# Patient Record
Sex: Female | Born: 1970 | Race: White | Hispanic: No | Marital: Married | Smoking: Never smoker
Health system: Southern US, Community
[De-identification: ages and names within clinical notes are randomized; demographics above are authoritative.]

## PROBLEM LIST (undated history)

## (undated) DIAGNOSIS — Z85828 Personal history of other malignant neoplasm of skin: Secondary | ICD-10-CM

## (undated) DIAGNOSIS — R112 Nausea with vomiting, unspecified: Secondary | ICD-10-CM

## (undated) DIAGNOSIS — F329 Major depressive disorder, single episode, unspecified: Secondary | ICD-10-CM

## (undated) DIAGNOSIS — A0472 Enterocolitis due to Clostridium difficile, not specified as recurrent: Secondary | ICD-10-CM

## (undated) DIAGNOSIS — I452 Bifascicular block: Secondary | ICD-10-CM

## (undated) DIAGNOSIS — F32A Depression, unspecified: Secondary | ICD-10-CM

## (undated) DIAGNOSIS — E785 Hyperlipidemia, unspecified: Secondary | ICD-10-CM

## (undated) DIAGNOSIS — F419 Anxiety disorder, unspecified: Secondary | ICD-10-CM

## (undated) DIAGNOSIS — K219 Gastro-esophageal reflux disease without esophagitis: Secondary | ICD-10-CM

## (undated) DIAGNOSIS — Z9889 Other specified postprocedural states: Secondary | ICD-10-CM

## (undated) DIAGNOSIS — R9389 Abnormal findings on diagnostic imaging of other specified body structures: Secondary | ICD-10-CM

## (undated) DIAGNOSIS — I493 Ventricular premature depolarization: Secondary | ICD-10-CM

## (undated) DIAGNOSIS — R351 Nocturia: Secondary | ICD-10-CM

## (undated) HISTORY — DX: Gastro-esophageal reflux disease without esophagitis: K21.9

## (undated) HISTORY — DX: Major depressive disorder, single episode, unspecified: F32.9

## (undated) HISTORY — DX: Ventricular premature depolarization: I49.3

## (undated) HISTORY — DX: Depression, unspecified: F32.A

## (undated) HISTORY — PX: LUMBAR FUSION: SHX111

## (undated) HISTORY — DX: Anxiety disorder, unspecified: F41.9

## (undated) HISTORY — PX: COLONOSCOPY: SHX174

## (undated) HISTORY — PX: TONSILLECTOMY: SUR1361

## (undated) HISTORY — PX: UPPER GASTROINTESTINAL ENDOSCOPY: SHX188

## (undated) HISTORY — DX: Hyperlipidemia, unspecified: E78.5

---

## 1997-10-17 ENCOUNTER — Ambulatory Visit (HOSPITAL_COMMUNITY): Admission: RE | Admit: 1997-10-17 | Discharge: 1997-10-17 | Payer: Self-pay | Admitting: Obstetrics and Gynecology

## 1997-11-20 ENCOUNTER — Ambulatory Visit (HOSPITAL_COMMUNITY): Admission: RE | Admit: 1997-11-20 | Discharge: 1997-11-20 | Payer: Self-pay | Admitting: Obstetrics and Gynecology

## 1997-11-29 ENCOUNTER — Emergency Department (HOSPITAL_COMMUNITY): Admission: EM | Admit: 1997-11-29 | Discharge: 1997-11-29 | Payer: Self-pay | Admitting: Emergency Medicine

## 1998-02-12 ENCOUNTER — Other Ambulatory Visit: Admission: RE | Admit: 1998-02-12 | Discharge: 1998-02-12 | Payer: Self-pay | Admitting: Obstetrics and Gynecology

## 1998-03-15 ENCOUNTER — Encounter: Payer: Self-pay | Admitting: Obstetrics and Gynecology

## 1998-03-15 ENCOUNTER — Ambulatory Visit (HOSPITAL_COMMUNITY): Admission: RE | Admit: 1998-03-15 | Discharge: 1998-03-15 | Payer: Self-pay | Admitting: Obstetrics and Gynecology

## 1998-12-23 ENCOUNTER — Other Ambulatory Visit: Admission: RE | Admit: 1998-12-23 | Discharge: 1998-12-23 | Payer: Self-pay | Admitting: Obstetrics and Gynecology

## 1999-04-12 ENCOUNTER — Inpatient Hospital Stay (HOSPITAL_COMMUNITY): Admission: AD | Admit: 1999-04-12 | Discharge: 1999-04-12 | Payer: Self-pay | Admitting: Obstetrics & Gynecology

## 1999-09-11 ENCOUNTER — Inpatient Hospital Stay (HOSPITAL_COMMUNITY): Admission: AD | Admit: 1999-09-11 | Discharge: 1999-09-11 | Payer: Self-pay | Admitting: Obstetrics and Gynecology

## 1999-09-20 ENCOUNTER — Inpatient Hospital Stay: Admission: AD | Admit: 1999-09-20 | Discharge: 1999-09-20 | Payer: Self-pay | Admitting: Obstetrics and Gynecology

## 1999-09-21 ENCOUNTER — Inpatient Hospital Stay (HOSPITAL_COMMUNITY): Admission: AD | Admit: 1999-09-21 | Discharge: 1999-09-24 | Payer: Self-pay | Admitting: Obstetrics & Gynecology

## 1999-09-25 ENCOUNTER — Encounter: Admission: RE | Admit: 1999-09-25 | Discharge: 1999-10-30 | Payer: Self-pay | Admitting: Obstetrics and Gynecology

## 1999-10-23 ENCOUNTER — Other Ambulatory Visit: Admission: RE | Admit: 1999-10-23 | Discharge: 1999-10-23 | Payer: Self-pay | Admitting: Obstetrics and Gynecology

## 2000-12-02 ENCOUNTER — Other Ambulatory Visit: Admission: RE | Admit: 2000-12-02 | Discharge: 2000-12-02 | Payer: Self-pay | Admitting: Obstetrics and Gynecology

## 2001-12-26 ENCOUNTER — Other Ambulatory Visit: Admission: RE | Admit: 2001-12-26 | Discharge: 2001-12-26 | Payer: Self-pay | Admitting: Obstetrics and Gynecology

## 2002-01-28 ENCOUNTER — Inpatient Hospital Stay (HOSPITAL_COMMUNITY): Admission: AD | Admit: 2002-01-28 | Discharge: 2002-01-28 | Payer: Self-pay | Admitting: Obstetrics & Gynecology

## 2003-01-04 ENCOUNTER — Other Ambulatory Visit: Admission: RE | Admit: 2003-01-04 | Discharge: 2003-01-04 | Payer: Self-pay | Admitting: Obstetrics and Gynecology

## 2003-01-22 ENCOUNTER — Other Ambulatory Visit: Admission: RE | Admit: 2003-01-22 | Discharge: 2003-01-22 | Payer: Self-pay | Admitting: Family Medicine

## 2003-07-23 ENCOUNTER — Ambulatory Visit (HOSPITAL_BASED_OUTPATIENT_CLINIC_OR_DEPARTMENT_OTHER): Admission: RE | Admit: 2003-07-23 | Discharge: 2003-07-23 | Payer: Self-pay | Admitting: Otolaryngology

## 2004-04-08 ENCOUNTER — Emergency Department (HOSPITAL_COMMUNITY): Admission: EM | Admit: 2004-04-08 | Discharge: 2004-04-08 | Payer: Self-pay | Admitting: Family Medicine

## 2004-05-04 HISTORY — PX: AUGMENTATION MAMMAPLASTY: SUR837

## 2004-05-07 ENCOUNTER — Ambulatory Visit (HOSPITAL_COMMUNITY): Admission: RE | Admit: 2004-05-07 | Discharge: 2004-05-07 | Payer: Self-pay | Admitting: Gastroenterology

## 2004-09-22 ENCOUNTER — Encounter: Admission: RE | Admit: 2004-09-22 | Discharge: 2004-09-22 | Payer: Self-pay | Admitting: Neurological Surgery

## 2005-03-25 ENCOUNTER — Encounter: Admission: RE | Admit: 2005-03-25 | Discharge: 2005-03-25 | Payer: Self-pay | Admitting: Gastroenterology

## 2005-04-26 ENCOUNTER — Encounter: Admission: RE | Admit: 2005-04-26 | Discharge: 2005-04-26 | Payer: Self-pay | Admitting: Neurology

## 2006-02-23 ENCOUNTER — Encounter: Admission: RE | Admit: 2006-02-23 | Discharge: 2006-02-23 | Payer: Self-pay | Admitting: Gastroenterology

## 2006-05-04 ENCOUNTER — Encounter (INDEPENDENT_AMBULATORY_CARE_PROVIDER_SITE_OTHER): Payer: Self-pay | Admitting: *Deleted

## 2006-06-28 ENCOUNTER — Ambulatory Visit: Payer: Self-pay

## 2006-07-02 ENCOUNTER — Encounter (INDEPENDENT_AMBULATORY_CARE_PROVIDER_SITE_OTHER): Payer: Self-pay | Admitting: *Deleted

## 2006-07-14 ENCOUNTER — Telehealth (INDEPENDENT_AMBULATORY_CARE_PROVIDER_SITE_OTHER): Payer: Self-pay | Admitting: *Deleted

## 2006-07-19 ENCOUNTER — Telehealth: Payer: Self-pay | Admitting: *Deleted

## 2006-07-20 ENCOUNTER — Encounter (INDEPENDENT_AMBULATORY_CARE_PROVIDER_SITE_OTHER): Payer: Self-pay | Admitting: Family Medicine

## 2006-07-20 ENCOUNTER — Ambulatory Visit (HOSPITAL_COMMUNITY): Admission: RE | Admit: 2006-07-20 | Discharge: 2006-07-20 | Payer: Self-pay | Admitting: Family Medicine

## 2006-07-20 ENCOUNTER — Ambulatory Visit: Payer: Self-pay | Admitting: Family Medicine

## 2006-07-20 ENCOUNTER — Encounter: Payer: Self-pay | Admitting: Family Medicine

## 2006-07-20 DIAGNOSIS — R079 Chest pain, unspecified: Secondary | ICD-10-CM | POA: Insufficient documentation

## 2006-07-20 DIAGNOSIS — R002 Palpitations: Secondary | ICD-10-CM | POA: Insufficient documentation

## 2006-07-21 ENCOUNTER — Telehealth: Payer: Self-pay | Admitting: *Deleted

## 2006-07-26 LAB — CONVERTED CEMR LAB
Albumin: 4.7 g/dL (ref 3.5–5.2)
Alkaline Phosphatase: 44 units/L (ref 39–117)
CO2: 23 meq/L (ref 19–32)
Calcium: 9.8 mg/dL (ref 8.4–10.5)
Chloride: 104 meq/L (ref 96–112)
Glucose, Bld: 84 mg/dL (ref 70–99)
Potassium: 4.1 meq/L (ref 3.5–5.3)
Sodium: 140 meq/L (ref 135–145)
Total Protein: 7.5 g/dL (ref 6.0–8.3)

## 2006-07-31 ENCOUNTER — Encounter (INDEPENDENT_AMBULATORY_CARE_PROVIDER_SITE_OTHER): Payer: Self-pay | Admitting: Family Medicine

## 2006-12-25 ENCOUNTER — Telehealth (INDEPENDENT_AMBULATORY_CARE_PROVIDER_SITE_OTHER): Payer: Self-pay | Admitting: Family Medicine

## 2007-01-06 ENCOUNTER — Encounter: Payer: Self-pay | Admitting: *Deleted

## 2007-01-09 ENCOUNTER — Emergency Department (HOSPITAL_COMMUNITY): Admission: EM | Admit: 2007-01-09 | Discharge: 2007-01-09 | Payer: Self-pay | Admitting: Family Medicine

## 2007-02-27 ENCOUNTER — Encounter: Admission: RE | Admit: 2007-02-27 | Discharge: 2007-02-27 | Payer: Self-pay | Admitting: Neurological Surgery

## 2007-03-16 ENCOUNTER — Ambulatory Visit: Payer: Self-pay | Admitting: Family Medicine

## 2007-03-28 ENCOUNTER — Ambulatory Visit: Payer: Self-pay | Admitting: Family Medicine

## 2007-03-28 LAB — CONVERTED CEMR LAB: Rapid Strep: NEGATIVE

## 2007-05-02 ENCOUNTER — Ambulatory Visit: Payer: Self-pay | Admitting: Sports Medicine

## 2007-05-19 ENCOUNTER — Encounter: Admission: RE | Admit: 2007-05-19 | Discharge: 2007-05-19 | Payer: Self-pay | Admitting: Internal Medicine

## 2007-06-13 ENCOUNTER — Telehealth: Payer: Self-pay | Admitting: *Deleted

## 2007-06-14 ENCOUNTER — Ambulatory Visit: Payer: Self-pay | Admitting: Family Medicine

## 2007-06-14 DIAGNOSIS — H8110 Benign paroxysmal vertigo, unspecified ear: Secondary | ICD-10-CM

## 2007-06-15 ENCOUNTER — Encounter: Payer: Self-pay | Admitting: *Deleted

## 2007-06-20 ENCOUNTER — Telehealth: Payer: Self-pay | Admitting: *Deleted

## 2007-07-10 ENCOUNTER — Emergency Department (HOSPITAL_COMMUNITY): Admission: EM | Admit: 2007-07-10 | Discharge: 2007-07-10 | Payer: Self-pay | Admitting: Family Medicine

## 2007-07-28 ENCOUNTER — Encounter (INDEPENDENT_AMBULATORY_CARE_PROVIDER_SITE_OTHER): Payer: Self-pay | Admitting: *Deleted

## 2008-01-24 ENCOUNTER — Ambulatory Visit: Payer: Self-pay | Admitting: Family Medicine

## 2008-09-03 ENCOUNTER — Emergency Department (HOSPITAL_COMMUNITY): Admission: EM | Admit: 2008-09-03 | Discharge: 2008-09-03 | Payer: Self-pay | Admitting: Emergency Medicine

## 2008-09-07 ENCOUNTER — Inpatient Hospital Stay (HOSPITAL_COMMUNITY): Admission: EM | Admit: 2008-09-07 | Discharge: 2008-09-10 | Payer: Self-pay | Admitting: Emergency Medicine

## 2008-09-27 ENCOUNTER — Encounter: Admission: RE | Admit: 2008-09-27 | Discharge: 2008-09-27 | Payer: Self-pay | Admitting: Internal Medicine

## 2009-04-17 ENCOUNTER — Encounter: Admission: RE | Admit: 2009-04-17 | Discharge: 2009-04-17 | Payer: Self-pay | Admitting: Gastroenterology

## 2009-07-02 DIAGNOSIS — Z8719 Personal history of other diseases of the digestive system: Secondary | ICD-10-CM

## 2009-08-01 ENCOUNTER — Encounter: Admission: RE | Admit: 2009-08-01 | Discharge: 2009-08-01 | Payer: Self-pay | Admitting: Gastroenterology

## 2009-09-02 ENCOUNTER — Ambulatory Visit: Payer: Self-pay | Admitting: Advanced Practice Midwife

## 2009-09-02 ENCOUNTER — Inpatient Hospital Stay (HOSPITAL_COMMUNITY): Admission: AD | Admit: 2009-09-02 | Discharge: 2009-09-02 | Payer: Self-pay | Admitting: Obstetrics & Gynecology

## 2009-10-12 ENCOUNTER — Inpatient Hospital Stay (HOSPITAL_COMMUNITY): Admission: AD | Admit: 2009-10-12 | Discharge: 2009-10-12 | Payer: Self-pay | Admitting: Obstetrics

## 2009-10-12 ENCOUNTER — Ambulatory Visit: Payer: Self-pay | Admitting: Obstetrics and Gynecology

## 2009-10-19 ENCOUNTER — Inpatient Hospital Stay (HOSPITAL_COMMUNITY): Admission: AD | Admit: 2009-10-19 | Discharge: 2009-10-19 | Payer: Self-pay | Admitting: Obstetrics & Gynecology

## 2009-10-19 ENCOUNTER — Ambulatory Visit: Payer: Self-pay | Admitting: Advanced Practice Midwife

## 2009-10-23 ENCOUNTER — Emergency Department (HOSPITAL_COMMUNITY): Admission: EM | Admit: 2009-10-23 | Discharge: 2009-10-23 | Payer: Self-pay | Admitting: Emergency Medicine

## 2009-12-31 DIAGNOSIS — R197 Diarrhea, unspecified: Secondary | ICD-10-CM

## 2010-01-13 ENCOUNTER — Encounter (INDEPENDENT_AMBULATORY_CARE_PROVIDER_SITE_OTHER): Payer: Self-pay | Admitting: *Deleted

## 2010-01-13 DIAGNOSIS — K219 Gastro-esophageal reflux disease without esophagitis: Secondary | ICD-10-CM | POA: Insufficient documentation

## 2010-01-28 ENCOUNTER — Ambulatory Visit: Payer: Self-pay | Admitting: Internal Medicine

## 2010-01-28 DIAGNOSIS — F341 Dysthymic disorder: Secondary | ICD-10-CM | POA: Insufficient documentation

## 2010-02-03 ENCOUNTER — Telehealth: Payer: Self-pay

## 2010-02-04 ENCOUNTER — Encounter: Payer: Self-pay | Admitting: Internal Medicine

## 2010-02-17 ENCOUNTER — Encounter: Admission: RE | Admit: 2010-02-17 | Discharge: 2010-02-17 | Payer: Self-pay | Admitting: Obstetrics and Gynecology

## 2010-05-13 ENCOUNTER — Telehealth: Payer: Self-pay | Admitting: Internal Medicine

## 2010-05-24 ENCOUNTER — Encounter: Payer: Self-pay | Admitting: Gastroenterology

## 2010-05-25 ENCOUNTER — Encounter: Payer: Self-pay | Admitting: Gastroenterology

## 2010-06-03 NOTE — Consult Note (Signed)
Summary: [facility]:  [facility]:   Imported By: Florinda Marker 02/04/2010 09:40:20  _____________________________________________________________________  External Attachment:    Type:   Image     Comment:   External Document

## 2010-06-03 NOTE — Miscellaneous (Signed)
Summary: Problems, Medications and Alleriges updated  Clinical Lists Changes  Problems: Added new problem of ESOPHAGEAL REFLUX (ICD-530.81) Added new problem of CLOSTRIDIUM DIFFICILE COLITIS, HX OF (ICD-V12.79) Added new problem of DIARRHEA (ICD-787.91) Medications: Added new medication of CELEXA 20 MG TABS (CITALOPRAM HYDROBROMIDE) Take 1 tablet by mouth once a day per PCP Added new medication of FLORASTOR 250 MG CAPS (SACCHAROMYCES BOULARDII) Take 1 capsule by mouth once a day per PCP Removed medication of TUSSIONEX PENNKINETIC ER 8-10 MG/5ML LQCR (CHLORPHENIRAMINE-HYDROCODONE) 1 teaspoon by mouth every 12 hrs as needed  dispense 60 mL Removed medication of MECLIZINE HCL 25 MG TABS (MECLIZINE HCL) 1 to two tabs by mouth three times a day as needed vertigo Allergies: Added new allergy or adverse reaction of CODEINE

## 2010-06-03 NOTE — Progress Notes (Signed)
Summary: Concerns about C-diff  Phone Note Call from Patient Call back at Home Phone [phone]   Caller: Patient Summary of Call: Patient has concerns that she may have c-diff again. She has pressure on her rectum and loose stools. She wants Dr.Campbell to give her a call, I tried to talk to the patient but she only wanted to discuss this issue with Dr. Orvan Falconer. She will be at [phone] until 2:00pm and at home after 3:00pm at [phone].  Initial call taken by: Starleen Arms CMA,  February 03, 2010 10:46 AM  Follow-up for Phone Call        I called and left her a message to call back at my office. Follow-up by: Cliffton Asters MD,  February 08, 2010 9:00 AM  Additional Follow-up for Phone Call Additional follow up Details #1::        Returned call made to patient. She is not symptomatic she is just very worried that  it may happen again and wanted to know if this was something that comes every 6 months or every two weeks.  I spoke with patient and reassured her at this time she should not be worried. She has no symptoms that indicate c-diff has returned. She admits she has become very obsessed with this lately. pt advised if c diff was treated successfully she should be ok.  Please feel free to call is symptoms return.  Additional Follow-up by: Tomasita Morrow RN,  February 10, 2010 9:45 AM

## 2010-06-03 NOTE — Miscellaneous (Signed)
Summary: HIPAA Restrictions  HIPAA Restrictions   Imported By: Florinda Marker 01/29/2010 11:02:48  _____________________________________________________________________  External Attachment:    Type:   Image     Comment:   External Document

## 2010-06-03 NOTE — Assessment & Plan Note (Signed)
Summary: new pt diarrhea/c.diff   Primary Provider:  [facility]  CC:  Second opinion. C. Diff Diarrhea vs IBS.  History of Present Illness: [name] is a 40 year old who is referred to me by Dr. 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 She says that she was in very good health until May of 2010 when she was admitted to the hospital here in [place] with mild diarrhea.  She tells me that one C. diff toxin assay was positive but a subsequent one was negative and the doctor told her that the first one must have been a false positive.  She was however treated with Flagyl and says she improved. The colon biopsy during that hospitalization did show active colitis.  She had recurrent loose stools with some mucus in the late fall and says that the toxin assay was positive again.  She was treated with a second round of Flagyl and thinks she got better.  She was referred to a gastroenterologist at [facility] who later referred her to Dr. Ronaldo Miyamoto in the IUD division there.  She says that she did not have any further diarrhea but would have intermittent episodes of loose stools with some mucus.  Apparently a toxin assay was positive again in April.  She says that that was done by the PCR method at [facility].  She eventually had two rounds of vancomycin taper the last one concluding with a two week course of rifaximin.   I do not have complete records from [facility] but she tells me that she had a subsequent positive assay they are and completed a course of fidaxomicin a little over one month ago. She says that her last 3 colonoscopies or sigmoidoscopies have been normal.  She has not had any abnormal stools in the past month but she is extremely worried, anxious, and depressed that she will always have relapse seen Clostridium difficile diarrhea.  She is also very concerned that she might pass it to her husband and daughter.  She has been started on Celexa  recently to try to help with her depression and anxiety.  She says that she spends a great deal of time searching the Internet but mostly finds scary stories that make her worried that she might die.  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  Preventive Screening-Counseling & Management  Alcohol-Tobacco     Alcohol drinks/day: 0     Smoking Status: never  Caffeine-Diet-Exercise     Caffeine use/day: no     Does Patient Exercise: no     Type of exercise: Walking, stationary bike     Times/week: 4  Safety-Violence-Falls     Seat Belt Use: yes   Updated Prior Medication List: CELEXA 20 MG TABS (CITALOPRAM HYDROBROMIDE) Take 1 tablet by mouth once a day per PCP FLORASTOR 250 MG CAPS (SACCHAROMYCES BOULARDII) Take 1 capsule by mouth once a day per PCP IMODIUM MULTI-SYMPTOM RELIEF 2-125 MG TABS (LOPERAMIDE-SIMETHICONE) Take 1 tablet by mouth once a day  Current Allergies (reviewed today): ! VICODIN ! CODEINE ! MACROBID MORPHINE Vital Signs:  Patient profile:   40 year old female Height:      65.5 inches (166.37 cm) Weight:      114 pounds (51.82 kg) BMI:     18.75 Temp:     98.5 degrees F (36.94 degrees C) oral Pulse rate:   70 / minute BP sitting:   102 / 68  (right arm) Cuff size:  regular  Vitals Entered By: [name] RN (January 28, 2010 11:25 AM) CC: Second opinion. C. Diff Diarrhea vs IBS Is Patient Diabetic? No Pain Assessment Patient in pain? no      Nutritional Status BMI of 19 -24 = normal Nutritional Status Detail appetite "good"  Have you ever been in a relationship where you felt threatened, hurt or afraid?not asked today, husband presnt   Does patient need assistance? Functional Status Self care Ambulation Normal   Physical Exam  General:  alert and well-nourished.   Mouth:  good dentition, pharynx pink and moist, no erythema, and no exudates.   Lungs:  normal breath sounds, no crackles, and no wheezes.   Heart:  normal  rate, regular rhythm, and no murmur.   Abdomen:  soft, non-tender, normal bowel sounds, no distention, no masses, no hepatomegaly, and no splenomegaly.   Skin:  no rashes.   Axillary Nodes:  no R axillary adenopathy and no L axillary adenopathy.   Psych:  normally interactive, good eye contact, but tearful, and moderately anxious.     Impression & Recommendations:  Problem # 1:  CLOSTRIDIUM DIFFICILE COLITIS, HX OF (ICD-V12.79) She has had a very difficult time over the last year and a half it has become convinced that she will never recover from her Clostridium difficile infection.  I suspect she did have symptomatic infection in May of 2010 but I told her that I'm unable to reconcile the disparate toxin assays and endoscopies since that time.  I told her however that I would put more emphasis on the normal endoscopies than on a positive toxin assay.  She and her husband clearly admit to that her anxiety is fueling her symptoms.  Her symptoms are actually quite mild and I have suggested that she simply ride them out rather than seeking further diagnostic testing as this is only likely to increase her anxiety.  I have told her that I do not expect her to have lifelong problems and that she certainly is not at all likely to die from this condition.  I have asked her to call me if she has other questions or concerns that I can help with. Orders: Consultation Level III (72536)  Medications Added to Medication List This Visit: 1)  Imodium Multi-symptom Relief 2-125 Mg Tabs (Loperamide-simethicone) .... Take 1 tablet by mouth once a day  Patient Instructions: 1)  Please schedule a follow-up appointment as needed.

## 2010-06-03 NOTE — Consult Note (Signed)
Summary: New Pt. Referral: Dr.Salen Ganem  New Pt. Referral: Dr.Salen Evette Cristal   Imported By: Florinda Marker 01/30/2010 15:52:34  _____________________________________________________________________  External Attachment:    Type:   Image     Comment:   External Document

## 2010-06-05 NOTE — Progress Notes (Signed)
Summary: Started having mucous in stool  Phone Note Call from Patient Call back at Work Phone [phone]   Caller: Patient Call For: Dr. Cliffton Asters Reason for Call: Acute Illness, Talk to Nurse Summary of Call: Pt. reports having mucous in her stool.  Wanting to know whether she should bring in a stool specimem.   Please advise. [name] RN  May 13, 2010 10:33 AM   Follow-up for Phone Call        I do not feel she needs any diagnostic evaluations unless she is having recurrent diarrhea. I am not concerned about mucous in her stoll unless there is diarrhea.   Follow-up by: Cliffton Asters MD,  May 13, 2010 12:40 PM

## 2010-07-09 ENCOUNTER — Encounter: Payer: Self-pay | Admitting: *Deleted

## 2010-07-20 LAB — LIPASE, BLOOD: Lipase: 27 U/L (ref 11–59)

## 2010-07-20 LAB — URINALYSIS, ROUTINE W REFLEX MICROSCOPIC
Nitrite: NEGATIVE
pH: 5.5 (ref 5.0–8.0)

## 2010-07-20 LAB — POCT I-STAT, CHEM 8
Calcium, Ion: 1.06 mmol/L — ABNORMAL LOW (ref 1.12–1.32)
Glucose, Bld: 104 mg/dL — ABNORMAL HIGH (ref 70–99)
HCT: 45 % (ref 36.0–46.0)
Hemoglobin: 15.3 g/dL — ABNORMAL HIGH (ref 12.0–15.0)

## 2010-07-20 LAB — DIFFERENTIAL
Basophils Absolute: 0 10*3/uL (ref 0.0–0.1)
Eosinophils Absolute: 0.1 10*3/uL (ref 0.0–0.7)
Eosinophils Relative: 1 % (ref 0–5)

## 2010-07-20 LAB — COMPREHENSIVE METABOLIC PANEL
ALT: 29 U/L (ref 0–35)
AST: 37 U/L (ref 0–37)
Alkaline Phosphatase: 45 U/L (ref 39–117)
CO2: 23 mEq/L (ref 19–32)
Chloride: 105 mEq/L (ref 96–112)
GFR calc Af Amer: >60 mL/min (ref >60)
GFR calc non Af Amer: >60 mL/min (ref >60)
Sodium: 138 mEq/L (ref 135–145)
Total Bilirubin: 0.9 mg/dL (ref 0.3–1.2)

## 2010-07-20 LAB — CBC
Hemoglobin: 14.1 g/dL (ref 12.0–15.0)
MCH: 30.8 pg (ref 26.0–34.0)
RBC: 4.57 MIL/uL (ref 3.87–5.11)

## 2010-07-20 LAB — URINE MICROSCOPIC-ADD ON

## 2010-07-21 LAB — URINALYSIS, ROUTINE W REFLEX MICROSCOPIC
Bilirubin Urine: NEGATIVE
Bilirubin Urine: NEGATIVE
Glucose, UA: NEGATIVE mg/dL
Ketones, ur: NEGATIVE mg/dL
Nitrite: NEGATIVE
Specific Gravity, Urine: 1.02 (ref 1.005–1.030)
Urobilinogen, UA: 0.2 mg/dL (ref 0.0–1.0)
pH: 5 (ref 5.0–8.0)
pH: 5.5 (ref 5.0–8.0)

## 2010-07-21 LAB — URINE MICROSCOPIC-ADD ON

## 2010-07-21 LAB — POCT PREGNANCY, URINE
Preg Test, Ur: NEGATIVE
Preg Test, Ur: NEGATIVE

## 2010-07-21 LAB — URINE CULTURE

## 2010-07-21 LAB — WET PREP, GENITAL: Trich, Wet Prep: NONE SEEN

## 2010-07-22 LAB — URINE MICROSCOPIC-ADD ON

## 2010-07-22 LAB — URINALYSIS, ROUTINE W REFLEX MICROSCOPIC
Bilirubin Urine: NEGATIVE
Glucose, UA: NEGATIVE mg/dL
Specific Gravity, Urine: 1.025 (ref 1.005–1.030)
pH: 6 (ref 5.0–8.0)

## 2010-07-22 LAB — POCT PREGNANCY, URINE: Preg Test, Ur: NEGATIVE

## 2010-08-12 LAB — COMPREHENSIVE METABOLIC PANEL
ALT: 34 U/L (ref 0–35)
ALT: 38 U/L — ABNORMAL HIGH (ref 0–35)
AST: 32 U/L (ref 0–37)
AST: 50 U/L — ABNORMAL HIGH (ref 0–37)
Albumin: 2.7 g/dL — ABNORMAL LOW (ref 3.5–5.2)
Albumin: 2.8 g/dL — ABNORMAL LOW (ref 3.5–5.2)
Alkaline Phosphatase: 45 U/L (ref 39–117)
Alkaline Phosphatase: 50 U/L (ref 39–117)
Alkaline Phosphatase: 55 U/L (ref 39–117)
BUN: 15 mg/dL (ref 6–23)
BUN: 5 mg/dL — ABNORMAL LOW (ref 6–23)
CO2: 29 mEq/L (ref 19–32)
CO2: 31 mEq/L (ref 19–32)
Calcium: 7.8 mg/dL — ABNORMAL LOW (ref 8.4–10.5)
Chloride: 103 mEq/L (ref 96–112)
Creatinine, Ser: 0.52 mg/dL (ref 0.4–1.2)
Creatinine, Ser: 0.86 mg/dL (ref 0.4–1.2)
GFR calc Af Amer: >60 mL/min (ref >60)
GFR calc non Af Amer: >60 mL/min (ref >60)
GFR calc non Af Amer: >60 mL/min (ref >60)
Glucose, Bld: 124 mg/dL — ABNORMAL HIGH (ref 70–99)
Glucose, Bld: 79 mg/dL (ref 70–99)
Potassium: 2.8 mEq/L — ABNORMAL LOW (ref 3.5–5.1)
Potassium: 3.6 mEq/L (ref 3.5–5.1)
Potassium: 4.1 mEq/L (ref 3.5–5.1)
Sodium: 138 mEq/L (ref 135–145)
Sodium: 140 mEq/L (ref 135–145)
Total Bilirubin: 0.5 mg/dL (ref 0.3–1.2)
Total Bilirubin: 0.6 mg/dL (ref 0.3–1.2)
Total Protein: 5.4 g/dL — ABNORMAL LOW (ref 6.0–8.3)
Total Protein: 7.2 g/dL (ref 6.0–8.3)

## 2010-08-12 LAB — DRUGS OF ABUSE SCREEN W/O ALC, ROUTINE URINE
Amphetamine Screen, Ur: NEGATIVE
Barbiturate Quant, Ur: NEGATIVE
Phencyclidine (PCP): NEGATIVE

## 2010-08-12 LAB — URINE MICROSCOPIC-ADD ON

## 2010-08-12 LAB — GIARDIA/CRYPTOSPORIDIUM SCREEN(EIA)

## 2010-08-12 LAB — DIFFERENTIAL
Basophils Absolute: 0 10*3/uL (ref 0.0–0.1)
Basophils Absolute: 0 10*3/uL (ref 0.0–0.1)
Basophils Relative: 0 % (ref 0–1)
Basophils Relative: 0 % (ref 0–1)
Eosinophils Absolute: 0 10*3/uL (ref 0.0–0.7)
Eosinophils Relative: 1 % (ref 0–5)
Lymphocytes Relative: 4 % — ABNORMAL LOW (ref 12–46)
Lymphocytes Relative: 7 % — ABNORMAL LOW (ref 12–46)
Monocytes Absolute: 0.2 10*3/uL (ref 0.1–1.0)
Monocytes Relative: 4 % (ref 3–12)
Monocytes Relative: 8 % (ref 3–12)
Monocytes Relative: 8 % (ref 3–12)
Neutro Abs: 4.6 10*3/uL (ref 1.7–7.7)
Neutro Abs: 8.6 10*3/uL — ABNORMAL HIGH (ref 1.7–7.7)
Neutrophils Relative %: 82 % — ABNORMAL HIGH (ref 43–77)
Neutrophils Relative %: 86 % — ABNORMAL HIGH (ref 43–77)

## 2010-08-12 LAB — CBC
HCT: 34.5 % — ABNORMAL LOW (ref 36.0–46.0)
HCT: 45.3 % (ref 36.0–46.0)
Hemoglobin: 12.4 g/dL (ref 12.0–15.0)
Hemoglobin: 15.5 g/dL — ABNORMAL HIGH (ref 12.0–15.0)
MCHC: 34.2 g/dL (ref 30.0–36.0)
MCHC: 34.6 g/dL (ref 30.0–36.0)
MCV: 89.5 fL (ref 78.0–100.0)
MCV: 90.3 fL (ref 78.0–100.0)
MCV: 90.6 fL (ref 78.0–100.0)
Platelets: 144 10*3/uL — ABNORMAL LOW (ref 150–400)
Platelets: 167 10*3/uL (ref 150–400)
RBC: 3.95 MIL/uL (ref 3.87–5.11)
RBC: 4.34 MIL/uL (ref 3.87–5.11)
RDW: 13.4 % (ref 11.5–15.5)
RDW: 13.4 % (ref 11.5–15.5)
WBC: 8.9 10*3/uL (ref 4.0–10.5)
WBC: 9.6 10*3/uL (ref 4.0–10.5)

## 2010-08-12 LAB — CLOSTRIDIUM DIFFICILE EIA
C difficile Toxins A+B, EIA: 8
C difficile Toxins A+B, EIA: NEGATIVE

## 2010-08-12 LAB — CULTURE, BLOOD (ROUTINE X 2)
Culture: NO GROWTH
Culture: NO GROWTH

## 2010-08-12 LAB — URINALYSIS, ROUTINE W REFLEX MICROSCOPIC
Leukocytes, UA: NEGATIVE
Nitrite: NEGATIVE
Nitrite: NEGATIVE
Specific Gravity, Urine: 1.02 (ref 1.005–1.030)
Specific Gravity, Urine: 1.031 — ABNORMAL HIGH (ref 1.005–1.030)
Urobilinogen, UA: 0.2 mg/dL (ref 0.0–1.0)
Urobilinogen, UA: 0.2 mg/dL (ref 0.0–1.0)
pH: 6 (ref 5.0–8.0)

## 2010-08-12 LAB — PHOSPHORUS: Phosphorus: 1.7 mg/dL — ABNORMAL LOW (ref 2.3–4.6)

## 2010-08-12 LAB — HIV ANTIBODY (ROUTINE TESTING W REFLEX): HIV: NONREACTIVE

## 2010-08-12 LAB — URINE CULTURE

## 2010-08-12 LAB — POTASSIUM: Potassium: 3 mEq/L — ABNORMAL LOW (ref 3.5–5.1)

## 2010-08-12 LAB — STOOL CULTURE

## 2010-08-12 LAB — MAGNESIUM: Magnesium: 1.6 mg/dL (ref 1.5–2.5)

## 2010-08-12 LAB — POCT PREGNANCY, URINE: Preg Test, Ur: NEGATIVE

## 2010-08-12 LAB — CALCIUM: Calcium: 7.8 mg/dL — ABNORMAL LOW (ref 8.4–10.5)

## 2010-08-12 LAB — TSH: TSH: 1.021 u[IU]/mL (ref 0.350–4.500)

## 2010-09-16 NOTE — Consult Note (Signed)
[name], [name]              ACCOUNT NO.:  [id number]   MEDICAL RECORD NO.:  [id number]          PATIENT TYPE:  INP   LOCATION:  5532                         FACILITY:  [facility]   PHYSICIAN:  James L. Malon Kindle., M.D.DATE OF BIRTH:  02-Jun-1970   DATE OF CONSULTATION:  09/08/2008  DATE OF DISCHARGE:                                 CONSULTATION   REQUESTING PHYSICIAN:  Monte Fantasia, MD   REASON FOR CONSULTATION:  Diarrhea and dehydration.   HISTORY:  A 40 year old white female who saw my partner, Dr. Herbert Moors  this past week.  She has had 7-8 days of diarrhea.  Prior to that, she  had normal bowel movements.  He saw her in the office with progressive  bloody diarrhea and performed a colonoscopy on Wednesday 3 days ago  showing diffuse colitis.  Biopsies were obtained for evaluation for  ulcerative colitis and she was started on Lialda.  She also to obtain  stool studies through our office the results of which are not available  at the [facility] on weekends.  She became progressively dehydrated, came  to the emergency room, was weak and dizzy, felt like she was going to  pass out when standing.  She has had 7 L of IV fluid currently.  Workup  in the hospital has revealed a normal white count.  Hemoglobin dropped  from 15.5 to 11.9 probably fluid related.  Electrolytes are grossly  okay.  Her albumin has dropped from 3.8 to 2.8 with hydration.  Stool  cultures are pending.  Stool was positive for C. diff toxin.  The  patient adamantly denies any exposure to antibiotics over the previous 3  months.  I specifically asked her about acne, sore throat, urinary tract  infections, etc., and she has not had any antibiotics at all.   MEDICATIONS ON ADMISSION:  Lialda, had just gotten some samples, no  other medicines.   ALLERGIES:  She is allergic to MORPHINE.   PAST MEDICAL HISTORY:  Childbirth related otherwise negative.   FAMILY HISTORY:  Negative for inflammatory bowel  disease.   PHYSICAL EXAMINATION:  VITAL SIGNS:  Temperature 101, pulse 86, blood  pressure 97/61.  GENERAL:  A very anxious, tearful white female.  EYES:  Sclerae nonicteric.  HEART:  Regular rate and rhythm without murmurs or gallops.  LUNGS:  Clear.  ABDOMEN:  Soft with mild nonlocalizing tenderness and markedly increased  bowel sounds.   ASSESSMENT:  Acute colitis, more than likely Clostridium difficile  related given the fact that this came on suddenly.  Clostridium  difficile can be associated with ulcerative colitis and we will  definitely need to get these biopsies back.  Given the fact that her  symptoms are so acute in onset, my feeling is that this is probably not  chronic ulcerative colitis, but rather Clostridium difficile colitis.   PLAN:  We will hold steroids for now, give clear liquids, and add oral  vancomycin to her current medications.  I would continue on Cipro and  Flagyl pending the other tests.  If her biopsies appear consistent with  ulcerative colitis,  we may need to consider IV steroids.           ______________________________  Llana Aliment Malon Kindle., M.D.     Waldron Session  D:  09/08/2008  T:  09/09/2008  Job:  [id number]   cc:   Dr. Wendi Snipes, M.D.

## 2010-09-16 NOTE — Discharge Summary (Signed)
NAMEJOSALYN, [name]              ACCOUNT NO.:  [id number]   MEDICAL RECORD NO.:  [id number]          PATIENT TYPE:  INP   LOCATION:  5532                         FACILITY:  [facility]   PHYSICIAN:  Renee Ramus, MD       DATE OF BIRTH:  1971/01/29   DATE OF ADMISSION:  09/07/2008  DATE OF DISCHARGE:  09/10/2008                               DISCHARGE SUMMARY   PRIMARY DISCHARGE DIAGNOSIS:  Viral gastroenteritis.   SECONDARY DIAGNOSES:  1. Anxiety.  2. Dehydration.   HOSPITAL COURSE:  1. Viral gastroenteritis.  The patient is a 40 year old female who was      admitted secondary to recurrent diarrhea refractory to conservative      treatment.  The patient was admitted.  She was severely dehydrated.      She received aggressive IV fluids.  She was placed on broad-      spectrum antibiotics with presumptive Clostridium difficile colitis      infection.  She had 1 positive toxin assay for Clostridium      difficile colitis.  The patient has also had a colonoscopy.  The      patient's biopsy results from the colonoscopy and several other      toxin assays have proved negative for Clostridium difficile      colitis, and we believe the patient has been suffering from      gastroenteritis.  The patient did not have a white count, did not      have fevers or chills.  She has had blood cultures and urine      cultures that have been negative for infection.  The patient is now      being discharged to home with instructions to follow up with Dr.      Evette Cristal, her gastroenterologist, within 1-2 weeks.  2. Anxiety.  The patient is somewhat anxious throughout this ordeal.      She has been tearful.  She has received Ativan with good effect.      This will not be continued postdischarge.  3. Dehydration.  The patient's dehydration has been resolved with      intravenous fluid.   LABORATORY DATA OF NOTE:  1. Initial dehydration with creatinine at 0.86 and BUN at 15.  Her BUN      is now decreased  to less than 1 and creatinine at 0.55.  2. TSH of 1.021.  3. UA initially showing concentrated urine with specific gravity of      1.031 with 80 ketones and evidence of protein.  Subsequent UA      showed persistent ketones but specific gravity which has decreased      to 1.020 with no evidence of infection.      a.     C. difficile toxin assay on Sep 07, 2008, positive.      b.     Urine culture showing multiple bacterial morphologies, none       predominant.      c.     Blood cultures x2 with no growth to date.  d.     Stool culture showing no suspicious colonies.  4. C-difficile toxin assay.  Followup on Sep 09, 2008, showing no      evidence of C. difficile.  5. Multiple C. difficile toxin assays done prior to admission showing      no evidence of colitis.  6. Biopsy report from colonoscopy showing evidence of colitis, but no      specific evidence of C. difficile colitis.   STUDIES:  Chest x-ray showing no acute cardiopulmonary disease.   MEDICATIONS ON DISCHARGE:  Flora-Q p.o. b.i.d. x1 week.  The patient  will be able to buy this over the counter.   There are no labs or studies pending at time of discharge.   The patient is in stable condition and anxious for discharge.   TIME SPENT:  35 minutes       Renee Ramus, MD  Electronically Signed     JF/MEDQ  D:  09/10/2008  T:  09/11/2008  Job:  [id number]   cc:   Graylin Shiver, M.D.  [facility]

## 2010-09-16 NOTE — H&P (Signed)
[name], [name]              ACCOUNT NO.:  [id number]   MEDICAL RECORD NO.:  [id number]          PATIENT TYPE:  INP   LOCATION:  1843                         FACILITY:  [facility]   PHYSICIAN:  Monte Fantasia, MD  DATE OF BIRTH:  Dec 22, 1970   DATE OF ADMISSION:  09/07/2008  DATE OF DISCHARGE:                              HISTORY & PHYSICAL   PRIMARY CARE PHYSICIAN:  [facility].   PATIENT TIME:  30 minutes.   GI DOCTOR:  Graylin Shiver, M.D.   This is a 40 year old with no significant past medical history that  comes in for severe diarrhea.  This started at 9:00  p.m. on Saturday;  started having more than 20 bowel movements a day.  She [facility].  She got hydrated there and was discharged home.  She went home  and continued to have diarrhea on Sunday.  She saw Dr. Evette Cristal on Monday.  He did a colonoscopy, biopsy and stool cultures are pending and she was  sent home on PPI.  Her diarrhea got better but on Wednesday, she  restarted having diarrhea with more than 20 bowel movements a day with  blood in stools and fever.  She was dizzy upon standing so she decided  to come to the ED. She relates no abdominal pain, normal appetite.   ALLERGIES:  MORPHINE CAUSES NAUSEA.   PAST MEDICAL HISTORY:  No significant past medical history.   MEDICATIONS:  None.   FAMILY HISTORY:  Mother hypertension, diabetes and hyperlipidemia.  Father hypertension, diabetes and hyperlipidemia.   SOCIAL HISTORY:  Denies smoking, alcohol, drugs or tobacco.  She works  as a [employer] operational officer.   REVIEW OF SYSTEMS:  She relates no rashes but is positive for weight  loss.  She has no abdominal pain.   PHYSICAL EXAMINATION:  Her temperature today is 100.4, blood pressure  117/70, respirations 38.  Her pulse is 105.  She is saturating 96% on  room air  GENERAL APPEARANCE:  She is uncomfortable appearing and tearful.  HEENT: Anicteric.  No pallor.  NECK:  Supple.  No lymphadenopathy and no  thyromegaly.  CARDIOVASCULAR:  Tachycardic, positive S1-S2.  No murmurs, rubs or  gallops.  LUNGS:  Good air movement and clear to auscultation.  ABDOMEN:  Positive bowel sounds, nontender, nondistended and soft.  EXTREMITIES:  Positive pulses.  No edema.  NEUROLOGIC:  Nonfocal.  SKIN:  No rashes.   Labs:  Sodium 140, potassium 3.6, chloride 111, bicarb 24, BUN of 5,  creatinine 0.5, glucose 106, bilirubin 0.6, alk phos 45, AST 30, ALT 26,  total protein 5.6, albumin  2.4 and calcium 7.8.  White count 5.0, ANC  of  4.6, hemoglobin of 11.9 and an MCV of 89.5 and platelets 144.  UA  was negative.   ASSESSMENT/PLAN:  1. Severe diarrhea in a 40 year old female being worked up by Dr.      Evette Cristal with results of colonoscopy and stool cultures pending.      Differential diagnosis is concerned for ulcerative colitis.  Also      infections.  Dr. Evette Cristal  results and stool cultures are pending and      he wants to start her on antibiotics.  So we will start on IV Cipro      and Flagyl.  If she spikes a fever, will get blood cultures x2.  We      will start  hydrating her aggressively with D5 in half-normal      saline.  Will give Tylenol for fever and Protonix and SCDs for      prophylaxis.  We will also check a TSH.      Marinda Elk, M.D.  Electronically Signed      Monte Fantasia, MD  Electronically Signed    AF/MEDQ  D:  09/07/2008  T:  09/07/2008  Job:  [id number]   cc:   [facility]
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
## 2010-09-19 NOTE — Op Note (Signed)
[facility] of [facility]  Patient:    [name], [name]                       MRN: [id number] Proc. Date: 09/21/99 Adm. Date:  [id number] Attending:  Genia Del                           Operative Report  PREOPERATIVE DIAGNOSIS:       [redacted] weeks gestation, breech presentation, borderline oligohydramnios, spontaneous rupture of membranes.  POSTOPERATIVE DIAGNOSIS:      [redacted] weeks gestation, breech presentation, borderline oligohydramnios, spontaneous rupture of membranes.  OPERATION:                    Urgent primary low transverse cesarean section.  SURGEON:                      Genia Del, M.D.  ASSISTANT:                    Lenoard Aden, M.D.  ANESTHESIA:                   Ellison Hughs., M.D.  ESTIMATED BLOOD LOSS:  DESCRIPTION OF PROCEDURE:     Under spinal anesthesia, the patient is in 15 degree left decubitus position.  The patient is prepped with Betadine on the abdominal, suprapubic, and vulvar areas.  The bladder catheter is inserted and the patient is draped as usual.  A Pfannenstiel incision is made with the scalpel.  We then opened the aponeurosis transversely with the Mayo scissors.  The aponeurosis is detached from the rectus muscles in the midline.  The parietoperitoneum is opened longitudinally.  The bladder blade is inserted.  The visceroperitoneum is opened transversely over the lower uterine segment.  The bladder is retracted downward.  A hysterotomy is made with a scalpel transversely in the lower uterine segment. t is drawn on each side with fingers.  The fetus is in frank breech presentation.  Birth at 4:30 p.m. of babygirl.  The cord was clamped and cut.  The baby was given to the neonatologist.  Apgars were 9 and 9.  Blood was taken from the cord. The babys weight came back 6 pounds 7 ounces.  The placenta was evacuated spontaneously complete three vessels.  Revision of the uterus was done.   Ancef  gram IV given after the birth of the baby.  Pitocin started in the IV fluids. he uterus was well contracted.  Closure of the hysterotomy in one full plane with  running locked suture with Vicryl 0.  A second plane is done to complete hemostasis with Vicryl 0.  Hemostasis is adequate.  The uterus is normal in appearance and  volume.  The tubes are normal and mobile.  The ovaries are normal in appearance and volume.  The pelvic cavity is irrigated and suctioned.  Hemostasis is verified n the rectus muscles and the aponeurosis.  It is completed with the electrocautery. The aponeurosis is closed in two half running sutures with Vicryl 0.  The adipose tissue is verified for hemostasis.  Electrocautery is used when necessary.  The  subcutaneous tissue is infiltrated with Marcaine 0.25% and then the skin is reapproximated with staples.  A dry dressing is applied.  The estimated blood loss was 500 cc.  Sponge, needle, and instrument counts were correct x 2.  No complications occurred.  The patient was transferred to the recovery room in good condition.  Blood group is O positive.  Rubella immune. DD:  09/21/99 TD:  09/22/99 Job: [id number]

## 2010-09-19 NOTE — H&P (Signed)
[facility]  Patient:    [name], [name]                       MRN: [id number] Adm. Date:  [id number] Attending:  Genia Del                         History and Physical  DATE OF BIRTH:                09-07-1970.  HISTORY:                      This is a 40 year old woman, G2, P0, A1, last menstrual period on December 30, 1998, for an expected date of delivery October 05, 1999, currently at [redacted] weeks gestation.  REASON FOR ADMISSION:         Spontaneous rupture of membranes at 1 p.m. on Sep 21, 1999, breech presentation.  HISTORY OF PRESENT ILLNESS:  Fetal movements positive, vaginal bleeding, irregular uterine contractions, no PIH symptoms.  Patient felt gush of fluids at 1 p.m. today, then clear fluids was coming out continuously.  PAST MEDICAL HISTORY:         Negative.  PAST SURGICAL HISTORY:        1. Back surgeries in 1995 and 1997.                               2. Had laparoscopy for tubal obstruction in                                  1998.  GYNECOLOGICAL HISTORY:        History of infertility and endometriosis x 5 years.  PAST OBSTETRICAL HISTORY:     In 1999, 8 weeks, therapeutic abortion, no complication.  MEDICATIONS:                  Prenatal vitamins.  ALLERGIES:                    MORPHINE and VICODIN.  SOCIAL HISTORY:               Married, nonsmoker.  HISTORY PRESENT PREGNANCY:    First trimester normal.  Labs in first trimester showed hemoglobin 12.5, platelets 247, blood group O positive, antibodies negative.  RPR nonreactive.  Rubella immune.  HBSAG negative. HIV nonreactive.   In the second trimester she had vaginal bleeding, for which an ultrasound was done showing low lying placenta with normal amniotic fluid. Fetal heart rate 155.  Then, she had triple test which was within normal limits.  Her review of anatomy on ultrasound was within normal limits at 20+ weeks.  Placenta was normal at that point.  Fluid was  normal.  One-hour GTT was increased at 28+ weeks.  Three-hour GTT was normal.  At 30+ weeks, an ultrasound showed oligohydramnios with appropriate growth and normal Dopplers. Follow-up ultrasound for amnio thick fluid showed inferior limits of normal fluid at the 10th percentile, breech presentation at 35+ weeks gestation.  The amniotic fluid index was at the 37th. percentile and Dopplers were normal. Persistent breech presentation was present.  The decision was made to schedule for a C-section given the borderline amniotic fluid index in the third trimester.  REVIEW OF SYSTEMS:  Constitutional negative.  HEENT:  Negative. Respiratory negative.  Cardiovascular negative.  GI negative.  Urological negative.  Dermatologic negative.  Neurologic negative.  PHYSICAL EXAMINATION  GENERAL:                      In no apparent distress.  VITAL SIGNS:                  Blood pressure 121/61, respiratory rate 20, pulse 72, temperature 98.7.  LUNGS:                        Normal at the office.  HEART:                        Normal at the office.  ABDOMEN:                      Uterus gravid.  Uterine height corresponds. Ultrasound at the bed showed persistent breech.  nitrazine and fern were positive.  Clear fluid was present.  Uterine contractions were slightly increased, irregular, and weak.  Fetal heart tone baseline 140/min.  Good accelerations.  No decelerations.  Good variability.  EXTREMITIES:                  Lower limbs normal.  IMPRESSION:                   Thirty-eight weeks gestation with persistent breech with spontaneous rupture of membranes, ____________ labor.  Had a meal at 11:30 this a.m. Fetal well-being reassuring.  Group beta Streptococcus was negative.  PLAN:                         Admit for urgent C-section.  Surgery plus risks discussed with the patient.  Will give antibiotic after the birth of the baby. Dr. Billy Coast will assist. DD:  09/21/99 TD:   09/21/99 Job: [id number]

## 2010-09-19 NOTE — Discharge Summary (Signed)
[facility] of [facility]  Patient:    [name], [name]                       MRN: [id number] Adm. Date:  [id number] Disc. Date: [id number] Attending:  Genia Del                           Discharge Summary  DATE OF BIRTH:                [birth year]  ADMISSION DIAGNOSIS:          A [redacted] week gestation, breech presentation, with spontaneous rupture of membranes in early labor.  PREOPERATIVE DIAGNOSIS:       A [redacted] week gestation, breech presentation, with spontaneous rupture of membranes in early labor.  SURGERY:                      Urgent primary low transverse C section done on Sep 21, 1999.  POSTOPERATIVE DIAGNOSES:      1. A [redacted] week gestation, breech presentation,                                  with spontaneous rupture of membranes in                                  early labor.                               2. Birth of a baby girl at 4:30 p.m. on Sep 21, 1999.  Apgars were 9 and 9.  DISCHARGE DIAGNOSES:          1. A [redacted] week gestation, breech presentation,                                  with spontaneous rupture of membranes in                                  early labor.                               2. Birth of a baby girl at 4:30 p.m. on Sep 21, 1999.  Apgars were 9 and 9.                               3. No postoperative complications.  HOSPITAL COURSE:              Uneventful.  Postoperative hemoglobin was 11.6. Hematocrit was 32.6.  Patient was discharged postoperative day #3. DD:  10/31/99 TD:  11/01/99 Job: [id number]

## 2010-09-19 NOTE — Op Note (Signed)
NAME:  [name], [name]              ACCOUNT NO.:  [id number]   MEDICAL RECORD NO.:  [id number]          PATIENT TYPE:  AMB   LOCATION:  ENDO                         FACILITY:  [facility]   PHYSICIAN:  Graylin Shiver, M.D.   DATE OF BIRTH:  July 22, 1970   DATE OF PROCEDURE:  05/07/2004  DATE OF DISCHARGE:                                 OPERATIVE REPORT   PROCEDURE:  Esophagogastroduodenoscopy with biopsy for CLOtest.   INDICATIONS:  History of gastroesophageal reflux.  Patient is now on Nexium  40 mg b.i.d. with noticeable improvement of her symptoms over the once-a-day  dosage.  Endoscopy is being done to look for any evidence of mucosal damage.   Informed consent was obtained after explanation of the risks of bleeding,  infection, and perforation.   PREMEDICATION:  Fentanyl 75 mcg IV, Versed 7.5 mg IV.   PROCEDURE:  With the patient in the left lateral decubitus position, the  Olympus gastroscope was inserted into the oropharynx and passed into the  esophagus.  It was advanced down the esophagus, then into the stomach and  into the duodenum.  The second portion and bulb of the duodenum looked  normal.  The stomach showed a mild diffuse erythematous appearance to the  mucosa compatible with gastritis.  No ulcers or erosions were seen.  Biopsy  for CLOtest was obtained.  The fundus and cardia looked normal on  retroflexion.  The esophagus revealed the esophagogastric junction to be  normal at 40 cm.  The esophageal mucosa looked normal.  She tolerated the  procedure well without complications.   IMPRESSION:  Mild gastritis.  No evidence of esophageal mucosal damage.   PLAN:  Continue Nexium 40 mg b.i.d.       SFG/MEDQ  D:  05/07/2004  T:  05/07/2004  Job:  [id number]   cc:   Bryan Lemma. Manus Gunning, M.D.  [address] [place]  [place] [address]  Fax: [phone]

## 2011-01-13 ENCOUNTER — Other Ambulatory Visit: Payer: Self-pay | Admitting: Obstetrics and Gynecology

## 2011-01-13 DIAGNOSIS — Z1231 Encounter for screening mammogram for malignant neoplasm of breast: Secondary | ICD-10-CM

## 2011-01-26 LAB — POCT URINALYSIS DIP (DEVICE)
Glucose, UA: NEGATIVE
Ketones, ur: NEGATIVE
Operator id: 235561
Protein, ur: NEGATIVE
Specific Gravity, Urine: 1.01

## 2011-01-26 LAB — URINE CULTURE: Culture: NO GROWTH

## 2011-01-26 LAB — WET PREP, GENITAL: Trich, Wet Prep: NONE SEEN

## 2011-01-26 LAB — POCT PREGNANCY, URINE: Operator id: 235561

## 2011-02-13 LAB — POCT URINALYSIS DIP (DEVICE)
Glucose, UA: NEGATIVE
Ketones, ur: NEGATIVE
Operator id: 247071
Protein, ur: NEGATIVE
Specific Gravity, Urine: 1.02
Urobilinogen, UA: 0.2

## 2011-02-20 ENCOUNTER — Ambulatory Visit: Payer: Self-pay

## 2011-03-25 ENCOUNTER — Ambulatory Visit: Payer: Self-pay

## 2011-04-01 ENCOUNTER — Ambulatory Visit
Admission: RE | Admit: 2011-04-01 | Discharge: 2011-04-01 | Disposition: A | Discharge status: Home / Self Care | Payer: BC Managed Care – PPO | Source: Ambulatory Visit | Attending: Obstetrics and Gynecology | Admitting: Obstetrics and Gynecology

## 2011-04-01 DIAGNOSIS — Z1231 Encounter for screening mammogram for malignant neoplasm of breast: Secondary | ICD-10-CM

## 2011-10-13 ENCOUNTER — Telehealth: Payer: Self-pay | Admitting: *Deleted

## 2011-10-13 NOTE — Telephone Encounter (Signed)
Patient called stating she is a previous patient of Dr. Blair Dolphin.  She was seen for CDiff,  01/28/10.  She was recently dx with a UTI and prescribed Keflex.  She has not taken it yet as she is afraid of another recurrence of CDiff.  Would like Dr. Blair Dolphin opinion. Wendall Mola CMA

## 2011-10-13 NOTE — Telephone Encounter (Signed)
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 She woke this morning with urinary urgency and saw her primary care physician, Dr. Tanya Nones, and was told that she had a mild bladder infection. She does not believe a urine culture was obtained. She was told her that she do one of 2 things. She could push fluids, cranberry juice and wait it out or try a narrow spectrum antibiotic like cephalexin. She states that she has taken some over-the-counter Azo and her urinary urgency is much better already. She states that she is deathly afraid of taking any antibiotics so I suggested that she try symptomatic therapy without antibiotics and she has she does over the next few days. I suggested that she call me back if she is not improving by end of the week.

## 2012-02-22 ENCOUNTER — Other Ambulatory Visit: Payer: Self-pay | Admitting: Obstetrics and Gynecology

## 2012-02-22 DIAGNOSIS — Z1231 Encounter for screening mammogram for malignant neoplasm of breast: Secondary | ICD-10-CM

## 2012-03-18 ENCOUNTER — Encounter (HOSPITAL_COMMUNITY): Payer: Self-pay | Admitting: Emergency Medicine

## 2012-03-18 ENCOUNTER — Telehealth: Payer: Self-pay

## 2012-03-18 ENCOUNTER — Emergency Department (HOSPITAL_COMMUNITY)
Admission: EM | Admit: 2012-03-18 | Discharge: 2012-03-18 | Disposition: A | Discharge status: Home / Self Care | Payer: BC Managed Care – PPO | Attending: Emergency Medicine | Admitting: Emergency Medicine

## 2012-03-18 DIAGNOSIS — A0472 Enterocolitis due to Clostridium difficile, not specified as recurrent: Secondary | ICD-10-CM | POA: Insufficient documentation

## 2012-03-18 HISTORY — DX: Enterocolitis due to Clostridium difficile, not specified as recurrent: A04.72

## 2012-03-18 LAB — CBC WITH DIFFERENTIAL/PLATELET
Basophils Absolute: 0 10*3/uL (ref 0.0–0.1)
Lymphocytes Relative: 14 % (ref 12–46)
Lymphs Abs: 0.9 10*3/uL (ref 0.7–4.0)
MCV: 89.6 fL (ref 78.0–100.0)
Neutro Abs: 5.7 10*3/uL (ref 1.7–7.7)
Platelets: 202 10*3/uL (ref 150–400)
RBC: 4.5 MIL/uL (ref 3.87–5.11)
RDW: 12.7 % (ref 11.5–15.5)
WBC: 6.9 10*3/uL (ref 4.0–10.5)

## 2012-03-18 LAB — CLOSTRIDIUM DIFFICILE BY PCR: Toxigenic C. Difficile by PCR: POSITIVE — AB

## 2012-03-18 LAB — COMPREHENSIVE METABOLIC PANEL
AST: 21 U/L (ref 0–37)
Albumin: 4.1 g/dL (ref 3.5–5.2)
BUN: 14 mg/dL (ref 6–23)
Calcium: 9.7 mg/dL (ref 8.4–10.5)
Creatinine, Ser: 0.7 mg/dL (ref 0.50–1.10)
Total Bilirubin: 0.5 mg/dL (ref 0.3–1.2)
Total Protein: 7.2 g/dL (ref 6.0–8.3)

## 2012-03-18 MED ORDER — METRONIDAZOLE 500 MG PO TABS: 500.0000 mg | ORAL_TABLET | Freq: Three times a day (TID) | ORAL | Status: DC

## 2012-03-18 NOTE — ED Notes (Signed)
Pts stool is well formed, brown stool with small amount of mucous.

## 2012-03-18 NOTE — ED Notes (Signed)
Pt states she has had mucous stool since last night. Pt has Hx of C.dif.

## 2012-03-18 NOTE — Telephone Encounter (Signed)
Pt called c/o she was given Flagyl which has never worked for c-diff with her.  She is requesting Dificid which she states was given by [facility]  I spoke with Dr Drue Second who stated she would like patient to take the flagyl for at least 3 days and follow up with Dr Orvan Falconer.  She does not feel  Dificid is needed at this point based on symptoms.   Appointment given with Dr Orvan Falconer for Thursday next week.  Pt has accepted the appointment and stated she will try the Flagyl.   Laurell Josephs, RN

## 2012-03-18 NOTE — ED Provider Notes (Signed)
History     CSN: [id number]  Arrival date & time 03/18/12  2440   First MD Initiated Contact with Patient 03/18/12 [phone]      Chief Complaint  Patient presents with  . Diarrhea    (Consider location/radiation/quality/duration/timing/severity/associated sxs/prior treatment) HPI Comments: 41 year old female with a history of C. difficile presents emergency department complaining of mucus stools as of last night. She was diagnosed with C. difficile 4 years ago, and was on antibiotics for one year. The C. difficile went away for 2 years and she believes his back as of last night. States she has normal formed stool with mucus along with just mucus when she did not half to move her bowels. Denies any other symptoms at this time. No abdominal pain, diarrhea, constipation, fever, chills, nausea or vomiting. No recent antibiotic usage. She takes Land on a daily basis. She was followed by [facility] along with Dr. Orvan Falconer with infectious disease.  Patient is a 41 y.o. female presenting with diarrhea. The history is provided by the patient.  Diarrhea Primary symptoms do not include abdominal pain, nausea or diarrhea.  The illness does not include constipation.    No past medical history on file.  No past surgical history on file.  No family history on file.  History  Substance Use Topics  . Smoking status: Not on file  . Smokeless tobacco: Not on file  . Alcohol Use: Not on file    OB History    No data available      Review of Systems  Gastrointestinal: Negative for nausea, abdominal pain, diarrhea, constipation and blood in stool.       Positive for mucus stools  All other systems reviewed and are negative.    Allergies  Codeine; Hydrocodone-acetaminophen; Morphine; and Nitrofurantoin  Home Medications   Current Outpatient Rx  Name  Route  Sig  Dispense  Refill  . CITALOPRAM HYDROBROMIDE 20 MG PO TABS   Oral   Take 20 mg by mouth daily. Per PCP          .  LOPERAMIDE-SIMETHICONE 2-125 MG PO TABS   Oral   Take 1 tablet by mouth daily.           Marland Kitchen SACCHAROMYCES BOULARDII 250 MG PO CAPS   Oral   Take 250 mg by mouth daily. Per PCP            BP 115/57  Pulse 84  Temp 98.2 F (36.8 C)  Resp 16  SpO2 99%  Physical Exam  Constitutional: She is oriented to person, place, and time. She appears well-developed and well-nourished. No distress.       Appears anxious and upset  HENT:  Head: Normocephalic and atraumatic.  Mouth/Throat: Oropharynx is clear and moist.  Eyes: Conjunctivae normal are normal.  Neck: Normal range of motion. Neck supple.  Cardiovascular: Normal rate, regular rhythm and normal heart sounds.   Pulmonary/Chest: Effort normal and breath sounds normal.  Abdominal: Soft. Bowel sounds are normal. She exhibits no distension and no mass. There is no tenderness.  Musculoskeletal: Normal range of motion.  Neurological: She is alert and oriented to person, place, and time.  Skin: Skin is warm and dry.  Psychiatric: Her speech is normal and behavior is normal. Her mood appears anxious.       Tearful    ED Course  Procedures (including critical care time)  Labs Reviewed  CBC WITH DIFFERENTIAL - Abnormal; Notable for the following:    Neutrophils  Relative 82 (*)     All other components within normal limits  CLOSTRIDIUM DIFFICILE BY PCR - Abnormal; Notable for the following:    C difficile by pcr POSITIVE (*)     All other components within normal limits  COMPREHENSIVE METABOLIC PANEL  STOOL CULTURE   No results found.   1. C. difficile colitis       MDM  41 y/o female with positive C. Diff. Spoke with ID Dr. Ilsa Iha who suggested patient begin Flagyl 500 mg TID if she has 3 more mucous stools. A member of her practice will call patient to set up an appt. Patient aware of this conversation with Dr. Ilsa Iha and understands plan.         Trevor Mace, PA-C 03/18/12 1109

## 2012-03-21 LAB — STOOL CULTURE

## 2012-03-22 ENCOUNTER — Telehealth: Payer: Self-pay | Admitting: *Deleted

## 2012-03-22 ENCOUNTER — Telehealth: Payer: Self-pay | Admitting: Licensed Clinical Social Worker

## 2012-03-22 NOTE — Telephone Encounter (Signed)
0

## 2012-03-22 NOTE — ED Provider Notes (Signed)
Medical screening examination/treatment/procedure(s) were performed by non-physician practitioner and as supervising physician I was immediately available for consultation/collaboration.   Dione Booze, MD 03/22/12 [phone]

## 2012-03-22 NOTE — Telephone Encounter (Signed)
Patient called stating that the flagyl is not helping for her diarrhea.  Has appt with Dr. Orvan Falconer this Thursday. Wendall Mola

## 2012-03-24 ENCOUNTER — Ambulatory Visit (INDEPENDENT_AMBULATORY_CARE_PROVIDER_SITE_OTHER): Payer: BC Managed Care – PPO | Admitting: Internal Medicine

## 2012-03-24 ENCOUNTER — Encounter: Payer: Self-pay | Admitting: Internal Medicine

## 2012-03-24 VITALS — BP 130/84 | HR 101 | Temp 98.3°F | Wt 119.0 lb

## 2012-03-24 DIAGNOSIS — Z8719 Personal history of other diseases of the digestive system: Secondary | ICD-10-CM

## 2012-03-24 MED ORDER — VANCOMYCIN HCL 125 MG PO CAPS: 125.0000 mg | ORAL_CAPSULE | Freq: Four times a day (QID) | ORAL | Status: DC

## 2012-03-24 MED ORDER — FIDAXOMICIN 200 MG PO TABS: 200.0000 mg | ORAL_TABLET | Freq: Two times a day (BID) | ORAL | Status: DC

## 2012-03-24 NOTE — Addendum Note (Signed)
Addended by: Cliffton Asters on: 03/24/2012 05:09 PM   Modules accepted: Orders

## 2012-03-24 NOTE — Progress Notes (Addendum)
Patient ID: [name], female   DOB: [birth year], 41 y.o.   MRN: [id number]    [facility] for Infectious Disease  Patient Active Problem List  Diagnosis  . ANXIETY DEPRESSION  . POSITIONAL VERTIGO  . ESOPHAGEAL REFLUX  . PALPITATIONS  . CHEST PAIN  . DIARRHEA  . CLOSTRIDIUM DIFFICILE COLITIS, HX OF    Patient's Medications  New Prescriptions   FIDAXOMICIN (DIFICID) 200 MG TABS TABLET    Take 1 tablet (200 mg total) by mouth 2 (two) times daily.  Previous Medications   CITALOPRAM (CELEXA) 10 MG TABLET    Take 10 mg by mouth daily.   SACCHAROMYCES BOULARDII (FLORASTOR) 250 MG CAPSULE    Take 250 mg by mouth daily. Per PCP   Modified Medications   No medications on file  Discontinued Medications   LOPERAMIDE HCL (IMODIUM PO)    Take 1 tablet by mouth every 4 (four) hours as needed. For diarrhea   METRONIDAZOLE (FLAGYL) 500 MG TABLET    Take 1 tablet (500 mg total) by mouth 3 (three) times daily.    Subjective: I last saw [name] in September of 2011. 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 She had a confusing history of conflicting C. Difficile assays and endoscopies. She had some positive assays interspersed with negative assays and normal endoscopies. She underwent treatments with metronidazole, multiple pulsed taper courses of vancomycin,rifaximin and finally fidaxomicin shortly before she saw me in 2011. After receiving fidaxomicin she finally improved and had been free of diarrhea for 2 years and 3 months before developing watery stools with mucus one week ago. She had been taking probiotics consistently along with 2 daily Imodium but stop the Imodium when she gets sick again. The only other antibiotic she has received in the last 2 years or 2 doses of trimethoprim sulfamethoxazole last July when she had cystitis. She stopped taking the trimethoprim sulfamethoxazole early because of concerns that it could provoke a relapse.  She  was seen in the emergency department on November 15 where her C. Difficile PCR was positive. She was started on oral metronidazole. She feels like her diarrhea has gotten more severe and frequent. She is also noted a metallic taste in her mouth and a white coating on her tongue. She has had some nausea since starting the metronidazole but no vomiting. She has not had any fever, chills or abdominal pain.  She admits that she has been obsessed with the possibility of relapse and it has been making her extremely anxious and depressed. She is also been dealing with recent onset of nighttime seizures and her 23 year old daughter. She is very interested in whether or not she would be a candidate for fecal transplant.   Objective: Temp: 98.3 F (36.8 C) (11/21 1505) Temp src: Oral (11/21 1505) BP: 130/84 mmHg (11/21 1505) Pulse Rate: 101  (11/21 1505)  General: she is tearful during the exam but alert and appropriate Skin: her face is flushed but she has no rash Lungs: clear Cor: regular S1-S2 no murmurs Abdomen: soft and nontender with hyperactive bowel sounds     Assessment: She developed another relapse of C. Difficile colitis, possibly triggered by the trimethoprim sulfamethoxazole last July. I will change her from metronidazole to fidaxomicin and have her follow up next week. If she has persistent diarrhea or relapses again she may be a good candidate to consider fecal transplant using our new protocol.  Plan: 1. Change metronidazole to  fidaxomicin 2. Followup in one week   Cliffton Asters, MD [facility] for Infectious Disease [facility] [phone] pager   [phone] cell 03/24/2012, 3:41 PM   Addendum: The [employer] [facility] preapproval form for fidaxomicin indicates that he will probably not be approved since she has not been on vancomycin within the past 30 days. I did submit the preapproval form with my note today requesting approval but will go ahead  and put her on vancomycin now as an intermediate step.  Cliffton Asters, MD [facility] for Infectious Disease [facility] [phone] pager   [phone] cell 03/24/2012, 5:09 PM

## 2012-03-28 ENCOUNTER — Other Ambulatory Visit: Payer: Self-pay | Admitting: Licensed Clinical Social Worker

## 2012-03-30 ENCOUNTER — Encounter: Payer: Self-pay | Admitting: Internal Medicine

## 2012-03-30 ENCOUNTER — Ambulatory Visit (INDEPENDENT_AMBULATORY_CARE_PROVIDER_SITE_OTHER): Payer: BC Managed Care – PPO | Admitting: Internal Medicine

## 2012-03-30 VITALS — BP 115/76 | HR 64 | Temp 97.6°F | Ht 66.0 in | Wt 118.5 lb

## 2012-03-30 DIAGNOSIS — Z8719 Personal history of other diseases of the digestive system: Secondary | ICD-10-CM

## 2012-03-30 DIAGNOSIS — R197 Diarrhea, unspecified: Secondary | ICD-10-CM

## 2012-03-30 NOTE — Progress Notes (Signed)
Patient ID: [name], female   DOB: 06-21-1970, 41 y.o.   MRN: [id number]    [facility] for Infectious Disease  Patient Active Problem List  Diagnosis  . ANXIETY DEPRESSION  . POSITIONAL VERTIGO  . ESOPHAGEAL REFLUX  . PALPITATIONS  . CHEST PAIN  . DIARRHEA  . CLOSTRIDIUM DIFFICILE COLITIS, HX OF    Patient's Medications  New Prescriptions   No medications on file  Previous Medications   CITALOPRAM (CELEXA) 10 MG TABLET    Take 10 mg by mouth daily.   SACCHAROMYCES BOULARDII (FLORASTOR) 250 MG CAPSULE    Take 250 mg by mouth daily. Per PCP    VANCOMYCIN (VANCOCIN) 125 MG CAPSULE    Take 1 capsule (125 mg total) by mouth 4 (four) times daily.  Modified Medications   No medications on file  Discontinued Medications   No medications on file    Subjective: [name] is in for her followup visit. She has now been on antibiotic therapy for C. Difficile colitis for 12 days including vancomycin orally for the past 7. She is feeling some better. She has not had any further nausea or vomiting. Her stool is not quite as watery but is not formed yet either. She is still having frequent bowel movements 4-5 times per day. She has not had any abdominal cramping and she states that her appetite and oral intake is good. She has not been having any fever.  Objective: Temp: 97.6 F (36.4 C) (11/27 1437) Temp src: Oral (11/27 1437) BP: 115/76 mmHg (11/27 1437) Pulse Rate: 64  (11/27 1437)  General: she is in much better spirits Lungs: clear Cor: regular S1 and S2 with no murmurs Abdomen: soft and nontender with active bowel sounds   Assessment: She is improving on vancomycin therapy for C. Difficile colitis. If her diarrhea persists or recurs I will try to get her access to a fidaxomicin. If she remains symptomatic beyond that trial then she would be a candidate for fecal transplantation.  Plan: 1. Complete one more week of oral vancomycin 2. Return to clinic in 2  weeks   Cliffton Asters, MD [facility] for Infectious Disease [facility] [phone] pager   [phone] cell 03/30/2012, 2:48 PM

## 2012-04-04 ENCOUNTER — Other Ambulatory Visit: Payer: Self-pay | Admitting: Licensed Clinical Social Worker

## 2012-04-04 ENCOUNTER — Telehealth: Payer: Self-pay | Admitting: *Deleted

## 2012-04-04 DIAGNOSIS — Z8719 Personal history of other diseases of the digestive system: Secondary | ICD-10-CM

## 2012-04-04 MED ORDER — VANCOMYCIN HCL 125 MG PO CAPS: 125.0000 mg | ORAL_CAPSULE | Freq: Four times a day (QID) | ORAL | Status: DC

## 2012-04-04 NOTE — Telephone Encounter (Signed)
Patient called and advised she is still having loose stools and it still smells bad. She advised Dr Orvan Falconer told her to call the clinic if this was still going on. She still reports no cramps or nausea. Advised her will send Dr Orvan Falconer a message and see what he wants her to do at this point or if he just wants to give it a few more days as she will take her last dose of antibiotic today.

## 2012-04-04 NOTE — Telephone Encounter (Signed)
Please ask Ms. Stockwell to call me by Friday of this week. If she continues to have diarrhea after completing oral vancomycin I will reapply to [facility] to see if she can be treated with Dificid.

## 2012-04-04 NOTE — Telephone Encounter (Signed)
Sophronia Simas Called the patient and advised her of Dr Blair Dolphin instructions.

## 2012-04-11 ENCOUNTER — Telehealth: Payer: Self-pay | Admitting: Licensed Clinical Social Worker

## 2012-04-11 NOTE — Telephone Encounter (Signed)
Patient left a message this morning stating that she  is still having frequent stools that are slightly formed. She wants the other medication Dificid called in for her and a prior approval done so she can get the medication. I will send this to Dr. Orvan Falconer per patient request. I attempted to call her back but she did not answer.

## 2012-04-12 ENCOUNTER — Other Ambulatory Visit: Payer: Self-pay | Admitting: Internal Medicine

## 2012-04-12 DIAGNOSIS — Z8719 Personal history of other diseases of the digestive system: Secondary | ICD-10-CM

## 2012-04-12 MED ORDER — FIDAXOMICIN 200 MG PO TABS: 200.0000 mg | ORAL_TABLET | Freq: Two times a day (BID) | ORAL | Status: DC

## 2012-04-12 NOTE — Telephone Encounter (Signed)
She is still symptomatic despite a recent course of vancomycin for recurrent C. difficile colitis. I will see if her insurance company will approve use of fidaxomicin.

## 2012-04-12 NOTE — Telephone Encounter (Signed)
I called the insurance to request another form to sign and fax back.

## 2012-04-14 ENCOUNTER — Ambulatory Visit (INDEPENDENT_AMBULATORY_CARE_PROVIDER_SITE_OTHER): Payer: BC Managed Care – PPO | Admitting: Internal Medicine

## 2012-04-14 ENCOUNTER — Encounter: Payer: Self-pay | Admitting: Internal Medicine

## 2012-04-14 VITALS — BP 108/75 | HR 64 | Temp 98.1°F | Ht 66.0 in | Wt 119.5 lb

## 2012-04-14 DIAGNOSIS — Z8719 Personal history of other diseases of the digestive system: Secondary | ICD-10-CM

## 2012-04-14 NOTE — Progress Notes (Signed)
Patient ID: [name], female   DOB: Aug 09, 1970, 41 y.o.   MRN: [id number]    [facility]  Patient Active Problem List  Diagnosis  . ANXIETY DEPRESSION  . POSITIONAL VERTIGO  . ESOPHAGEAL REFLUX  . PALPITATIONS  . CHEST PAIN  . DIARRHEA  . CLOSTRIDIUM DIFFICILE COLITIS, HX OF    Patient's Medications  New Prescriptions   No medications on file  Previous Medications   CITALOPRAM (CELEXA) 10 MG TABLET    Take 10 mg by mouth daily.   FIDAXOMICIN (DIFICID) 200 MG TABS TABLET    Take 1 tablet (200 mg total) by mouth 2 (two) times daily.   SACCHAROMYCES BOULARDII (FLORASTOR) 250 MG CAPSULE    Take 250 mg by mouth daily. Per PCP    VANCOMYCIN (VANCOCIN) 250 MG CAPSULE    Take 250 mg by mouth 4 (four) times daily.  Modified Medications   No medications on file  Discontinued Medications   No medications on file    Subjective: She has now been on oral vancomycin for 2-1/2 weeks and is still having 5-6 loose stools daily. She has intermittent mucus in her stools but no blood. She does not have any nausea, vomiting or abdominal cramping.  Objective: Temp: 98.1 F (36.7 C) (12/12 0856) Temp src: Oral (12/12 0856) BP: 108/75 mmHg (12/12 0856) Pulse Rate: 64  (12/12 0856)  General: She is thin her weight is stable at 119 pounds. She is tearful. Abdomen: Soft and nontender with quiet bowel sounds   Assessment: She has persistent symptoms of C. difficile colitis which has been recurrent over the last 3 years. This is despite another round of oral vancomycin. I will try to obtain fidaxomicin for her. I will also set her up to see my partner, Dr. Jerolyn Center, for consideration of fecal transplantation.  Plan: 1. Attempt to get fidaxomicin 2. Consider fecal transplantation if she is not getting better   Cliffton Asters, MD [facility] [phone] pager   [phone] cell 04/14/2012, 9:34 AM

## 2012-04-19 ENCOUNTER — Encounter: Payer: Self-pay | Admitting: Internal Medicine

## 2012-04-19 ENCOUNTER — Ambulatory Visit (INDEPENDENT_AMBULATORY_CARE_PROVIDER_SITE_OTHER): Payer: BC Managed Care – PPO | Admitting: Internal Medicine

## 2012-04-19 ENCOUNTER — Ambulatory Visit: Payer: BC Managed Care – PPO | Admitting: Internal Medicine

## 2012-04-19 VITALS — BP 120/75 | HR 70 | Temp 98.1°F | Wt 120.0 lb

## 2012-04-19 DIAGNOSIS — A0471 Enterocolitis due to Clostridium difficile, recurrent: Secondary | ICD-10-CM

## 2012-04-19 DIAGNOSIS — A0472 Enterocolitis due to Clostridium difficile, not specified as recurrent: Secondary | ICD-10-CM

## 2012-04-19 NOTE — Progress Notes (Signed)
[facility] CLINIC NOTE  RFV: evaluation for FMT Subjective:    Patient ID: [name], female    DOB: 12-23-1970, 41 y.o.   MRN: [id number]  HPI [name] is a pleasant 41yo F with history of Cdifficile colitis in 2009-2010, had been disease free until this year. On 11/15 recurrence went to ED, (+ Cdiff pcr), she was first given flagyl which did not improve her symptoms, then subsequently transitioned to oral vanco. She had been seen in ID clinic on12/12  by Dr. Orvan Falconer, where still having 5-6 loose stools, no significant improvement and now switched to fidoxamicin.  Originally had cdifficile in 2009- 2010. Initially thought had viral gastroenteritis, was eventually hospitalized since due to dehydration, and weight loss #10. Interestingly had no history of having recent antibiotics, this was roughly in MAy of 2010. She relapsed in oct 2010, had significant work up ( 3 addn colonoscopies). Treated by ID at [facility], Dr. Kerby Nora, vancomycin taper. Xifaxin, as well as fidoxamicin as a new drug at the time, where it finally relieved her symptoms. She was fine for 68yr and 3 months.This past June had uti, had 2 doses of bactrim and stopped early due to concern for recurrence.  Mid November went to ED due to acute mucousy, nonbloody loose and watery stool where she was placed on flagyl sick to her stomach, but transitioned to vancomycin but still had 3-4 BM. Decision to transition to fidoxamicin( now of day  #4 of treatment). Still having 3-4 loose stools per day, but "mushy", no longer having mucous in her stool.Doesn't feel that vanco working like it previously did in 2010 Current Outpatient Prescriptions on File Prior to Visit  Medication Sig Dispense Refill  . citalopram (CELEXA) 10 MG tablet Take 10 mg by mouth daily.      . fidaxomicin (DIFICID) 200 MG TABS tablet Take 1 tablet (200 mg total) by mouth 2 (two) times daily.  20 tablet  0  . saccharomyces boulardii (FLORASTOR) 250 MG capsule Take 250  mg by mouth daily. Per PCP        Active Ambulatory Problems    Diagnosis Date Noted  . ANXIETY DEPRESSION 01/28/2010  . POSITIONAL VERTIGO 06/14/2007  . ESOPHAGEAL REFLUX 01/13/2010  . PALPITATIONS 07/20/2006  . CHEST PAIN 07/20/2006  . DIARRHEA 12/31/2009  . CLOSTRIDIUM DIFFICILE COLITIS, HX OF 07/02/2009   Resolved Ambulatory Problems    Diagnosis Date Noted  . No Resolved Ambulatory Problems   Past Medical History  Diagnosis Date  . C. difficile diarrhea   . Anxiety and depression    History   Social History  . Marital Status: Married    Spouse Name: N/A    Number of Children: 1  . 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 Smoking status: Never Smoker   . Smokeless tobacco: Never Used  . Alcohol Use: No  . Drug Use: No  . Sexually Active: Not on file   Other Topics Concern  . Not on file   Social History Narrative  . No narrative on file   family history includes Diabetes in her father and mother and Lymphoma in her paternal grandmother.  Review of Systems 10 point ros reviewed negative except what is mentioned in HPI    Objective:   Physical Exam BP 120/75  Pulse 70  Temp 98.1 F (36.7 C) (Oral)  Wt 120 lb (54.432 kg) Physical Exam  Constitutional:  oriented to person, place, and time.  well-developed and well-nourished. tearful HENT: [place]/AT, perrla, eomi, no scleral icterus Mouth/Throat: Oropharynx is clear and moist. No oropharyngeal exudate.  Cardiovascular: Normal rate, regular rhythm and normal heart sounds. Exam reveals no gallop and no friction rub. No murmur heard.  Pulmonary/Chest: Effort normal and breath sounds normal. No respiratory distress. He has no wheezes.  Abdominal: Soft. Bowel sounds are normal. He exhibits no distension. There is no tenderness.  Lymphadenopathy:  no cervical adenopathy.   Skin: Skin is warm and dry. No rash noted. No erythema.  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 Interestingly she had been disease free for 2 yrs and now has recurrence. Did have some antibiotic exposure several months ago but unusual for c.difficile not to occur shortly thereafter if this is antibiotic-associated.  plan = continue on fidoxamicin. Will see if can do patient assistance for 30 days of coverage; refer to Dr. Leone Payor to see if we can evaluate for FMT.   - continue with florastor BID  - will arrange for her brother to be screened: [name] 11/06/60  Greater than 45 min spent discuss process of FMT, IC and cost associated with testing donor and counseling for c.difficile treatment  ([email or web address])

## 2012-04-20 ENCOUNTER — Other Ambulatory Visit: Payer: Self-pay

## 2012-04-20 ENCOUNTER — Encounter: Payer: Self-pay | Admitting: *Deleted

## 2012-04-20 ENCOUNTER — Other Ambulatory Visit: Payer: BC Managed Care – PPO

## 2012-04-20 ENCOUNTER — Ambulatory Visit (INDEPENDENT_AMBULATORY_CARE_PROVIDER_SITE_OTHER): Payer: BC Managed Care – PPO | Admitting: Internal Medicine

## 2012-04-20 ENCOUNTER — Encounter: Payer: Self-pay | Admitting: Internal Medicine

## 2012-04-20 VITALS — BP 100/62 | HR 96 | Ht 65.25 in | Wt 118.4 lb

## 2012-04-20 DIAGNOSIS — A0472 Enterocolitis due to Clostridium difficile, not specified as recurrent: Secondary | ICD-10-CM

## 2012-04-20 MED ORDER — POLYETHYLENE GLYCOL 3350 17 GM/SCOOP PO POWD: ORAL | Status: DC

## 2012-04-20 MED ORDER — METOCLOPRAMIDE HCL 10 MG PO TABS: ORAL_TABLET | ORAL | Status: DC

## 2012-04-20 MED ORDER — BISACODYL 5 MG PO TBEC: DELAYED_RELEASE_TABLET | ORAL | Status: DC

## 2012-04-20 NOTE — Progress Notes (Signed)
Subjective:  Referred by Cynthia Snider, MD   Patient ID: [name], female    DOB: 12/16/1970, 41 y.o.   MRN: [id number]  HPI This is a very nice 41yo white woman with a history of Cdifficile colitis in 2009-2010 with resolution finally after numerous treatments. On 03/18/2012 she  went to ED because of recurrent diarrhea that reminded her of C diff., (+ Cdiff pcr). Treatment  was first metronidazole which did not improve her symptoms, then subsequently changed to oral vancomycin. She had been seen in ID clinic on12/12/13 by Dr. Campbell, and was still having 5-6 loose stools, no significant improvement and changed to fidoxamicin. She is improving with less diarrhea but not yet resolved. The fidoxamicin was $900, she is getting help from Dr. Snider to hopefully get reimbursed due to metronidazole and vancomycin failure.  Originally had cdifficile in 2009- 2010. Initially thought had viral gastroenteritis, was eventually hospitalized since due to dehydration, and weight loss #10. Interestingly had no history of having recent antibiotics, this was roughly in May of 2010. She relapsed in oct 2010, had significant work up ( 3 addn colonoscopies). Treated by ID at [facility], Dr. Peacock, vancomycin taper. Xifaxin, as well as fidoxamicin as a new drug at the time, where it finally relieved her symptoms. She was fine for 2yr and 3 months.This past June had uti, had 2 doses of Bactrim and stopped early due to concern for recurrence.  Mid November went to ED due to acute mucousy, nonbloody loose and watery stool where she was placed on flagyl sick to her stomach, but transitioned to vancomycin but still had 3-4 BM. Decision to transition to fidoxamicin. Still having 3-4 loose stools per day, but "mushy", no longer having mucous in her stool.Doesn't feel that vancomycin as effective as it was in 2010.  She has been on Florastor chronically since 2010.  Allergies  Allergen Reactions  . Codeine   .  Hydrocodone-Acetaminophen     REACTION: Palpitations  . Morphine     REACTION: Nausea  . Nitrofurantoin     REACTION: flu-like symtoms   Outpatient Prescriptions Prior to Visit  Medication Sig Dispense Refill  . citalopram (CELEXA) 10 MG tablet Take 10 mg by mouth daily.      . fidaxomicin (DIFICID) 200 MG TABS tablet Take 1 tablet (200 mg total) by mouth 2 (two) times daily.  20 tablet  0  . saccharomyces boulardii (FLORASTOR) 250 MG capsule Take 250 mg by mouth daily. Per PCP       . [DISCONTINUED] vancomycin (VANCOCIN) 250 MG capsule Take 250 mg by mouth 4 (four) times daily.       Last reviewed on 04/20/2012  9:15 AM by Sophia A Davis, CMA Past Medical History  Diagnosis Date  . C. difficile diarrhea   . Esophageal reflux   . Anxiety and depression    Past Surgical History  Procedure Date  . Lumbar fusion 1994, 1997    x 2, T11-L3  . Cesarean section   . Upper gastrointestinal endoscopy   . Colonoscopy    History   Social History  . Marital Status: Married    Spouse Name: N/A    Number of Children: 1  . Years of Education: N/A   Occupational History  . manager [employer]   Social History Main Topics  . Smoking status: Never Smoker   . Smokeless tobacco: Never Used  . Alcohol Use: No  . Drug Use: No  . Sexually Active:   None   Other Topics Concern  . None   Social History Narrative  . None   Family History  Problem Relation Age of Onset  . Diabetes Mother   . Diabetes Father   . Lymphoma Paternal Grandmother    Review of Systems + anxiety, night sweats, insomnia and is menopausal. All other ROS negative except as per HPI    Objective:   Physical Exam General:  Well-developed, well-nourished and in no acute distress Eyes:  anicteric. ENT:   Mouth and posterior pharynx free of lesions.  Neck:   supple w/o thyromegaly or mass.  Lungs: Clear to auscultation bilaterally. Heart:  S1S2, no rubs, murmurs, gallops. Abdomen:  soft,  non-tender, no hepatosplenomegaly, hernia, or mass and BS+.  Lymph:  no cervical or supraclavicular adenopathy. Extremities:   no edema Skin   no rash. Neuro:  A&O x 3.  Psych:  appropriate mood and  Affect.   Data Reviewed: ID notes Previous colonoscopy and GI consult notes 2010    Assessment & Plan:   1. C. difficile colitis - recurrent    1. I agree that she is an appropriate candidate for fecal microbiotica transplant 2. Risks and benefits of colonoscopy-administered FMT explained to patient. She was given a copy of the IRB consent. 3. Anticipate FMT off fidoxamicin once brother screened. 4. She has MiraLax prep for colonoscopy instructions - will call her and fine tune those once we have date/time 5. She may try DanActive yogurt drink   Cc:PICKARD,WARREN TOM, MD and CYNTHIA SNIDER, MD  

## 2012-04-20 NOTE — Telephone Encounter (Signed)
Reglan, Miralax and Dulcolax sent to [facility] by accident , re-sent to [facility] as patient requested.  Called [facility] and LM on there VM cancelling rx's.

## 2012-04-20 NOTE — Patient Instructions (Addendum)
DanActive is the yogurt drink we discussed.  My office will contact you about the timing of the colonoscopy and fecal microbiotica transplant.  Today we are giving you Miralax instructions for prepping for your colonoscopy.  Thank you for choosing me and [facility].  Iva Boop, M.D., [facility]

## 2012-04-20 NOTE — Progress Notes (Signed)
Assisted the patient with the filling out of her Dificid patient assistance. Filled out the forms and faxed the information along with documentation to the company. Was called within 30 mins to advised that the patient was approved and will be reimbursed for the out of pocket form 04/2012 $917.12 but that per her insurance plan she has maxed out her Rx limit and until it resets 05/04/2012 she will have to pay out of pocket for Any Rx she may need filled. They have been in contact with the patient and she understands but once she resets in January they will help her with this medication.

## 2012-04-22 ENCOUNTER — Telehealth: Payer: Self-pay | Admitting: *Deleted

## 2012-04-22 NOTE — Telephone Encounter (Signed)
Patient called stating she will be out of her antibiotic on Monday and is requesting a refill.  She also emailed Dr. Duayne Cal CMA

## 2012-04-24 NOTE — Telephone Encounter (Signed)
We will need to check with [name] if the patient has been approved for patient assistance from the pharmaceutical company for dificid. If not approve, will give her rx for vancomycin 125mg  QID x 14 d

## 2012-04-25 ENCOUNTER — Ambulatory Visit (INDEPENDENT_AMBULATORY_CARE_PROVIDER_SITE_OTHER): Payer: BC Managed Care – PPO | Admitting: Internal Medicine

## 2012-04-25 ENCOUNTER — Other Ambulatory Visit: Payer: Self-pay | Admitting: *Deleted

## 2012-04-25 DIAGNOSIS — Z8719 Personal history of other diseases of the digestive system: Secondary | ICD-10-CM

## 2012-04-25 DIAGNOSIS — A0472 Enterocolitis due to Clostridium difficile, not specified as recurrent: Secondary | ICD-10-CM

## 2012-04-25 MED ORDER — VANCOMYCIN 50 MG/ML ORAL SOLUTION: 125.0000 mg | Freq: Four times a day (QID) | ORAL | Status: DC

## 2012-04-25 NOTE — Progress Notes (Signed)
Patient ID: [name], female   DOB: July 23, 1970, 41 y.o.   MRN: [id number]  Patient will do a 10 day course of oral vancomycin since she is still having loose stools. dificid course has finished. Unable to get patient assist to cover until January 1st. Patient has capped her outpatient pharmacy coverage.  Will send in rx to [facility] vanco liquid 125mg  QID x 10day= $112 per course

## 2012-04-25 NOTE — Telephone Encounter (Signed)
Per Dr Drue Second after conversation with the patient.

## 2012-05-03 ENCOUNTER — Ambulatory Visit
Admission: RE | Admit: 2012-05-03 | Discharge: 2012-05-03 | Disposition: A | Discharge status: Home / Self Care | Payer: BC Managed Care – PPO | Source: Ambulatory Visit | Attending: Obstetrics and Gynecology | Admitting: Obstetrics and Gynecology

## 2012-05-03 DIAGNOSIS — Z1231 Encounter for screening mammogram for malignant neoplasm of breast: Secondary | ICD-10-CM

## 2012-05-04 DIAGNOSIS — Z8619 Personal history of other infectious and parasitic diseases: Secondary | ICD-10-CM

## 2012-05-04 HISTORY — DX: Personal history of other infectious and parasitic diseases: Z86.19

## 2012-05-05 ENCOUNTER — Other Ambulatory Visit: Payer: Self-pay | Admitting: Internal Medicine

## 2012-05-09 ENCOUNTER — Encounter: Payer: Self-pay | Admitting: Internal Medicine

## 2012-05-09 ENCOUNTER — Other Ambulatory Visit: Payer: Self-pay

## 2012-05-09 ENCOUNTER — Telehealth: Payer: Self-pay

## 2012-05-09 DIAGNOSIS — A0472 Enterocolitis due to Clostridium difficile, not specified as recurrent: Secondary | ICD-10-CM

## 2012-05-09 NOTE — Telephone Encounter (Signed)
Patient is scheduled for 05/16/12 12:30 , she will come for a pre-visit on 05/12/12 0830 at [facility]..  She has two days left of vancomycin.

## 2012-05-09 NOTE — Telephone Encounter (Signed)
Message copied by Annett Fabian on Mon May 09, 2012  9:52 AM ------      Message from: Iva Boop      Created: Fri May 06, 2012  2:33 PM      Regarding: RE: FMT -  scheduling       Midday 1/13 or 1/14 at [place]            If not then week of 1/20 (hospital week)      ----- Message -----         From: Rossie Muskrat, RN,CGRN         Sent: 05/06/2012   1:51 PM           To: Iva Boop, MD      Subject: RE: FMT -  scheduling                                    [facility] could do it at 1:00 on Monday, but the patient absolutely can't do Monday.  Any other options?      ----- Message -----         From: Iva Boop, MD         Sent: 05/06/2012   1:26 PM           To: Rossie Muskrat, RN,CGRN      Subject: RE: FMT -  scheduling                                    Any chance they would do her at [facility]?      ----- Message -----         From: Rossie Muskrat, RN,CGRN         Sent: 05/06/2012   9:23 AM           To: Iva Boop, MD      Subject: RE: FMT -  scheduling                                    There is no availability either day, any other options?      ----- Message -----         From: Iva Boop, MD         Sent: 05/02/2012   5:44 PM           To: Rossie Muskrat, RN,CGRN      Subject: FMT -  scheduling                                        Please see if I can do an FMT in this lady 1/6 midday?            Or 1/10 midday

## 2012-05-10 ENCOUNTER — Other Ambulatory Visit: Payer: Self-pay | Admitting: Internal Medicine

## 2012-05-10 DIAGNOSIS — A0472 Enterocolitis due to Clostridium difficile, not specified as recurrent: Secondary | ICD-10-CM

## 2012-05-10 MED ORDER — VANCOMYCIN HCL 125 MG PO CAPS: 125.0000 mg | ORAL_CAPSULE | Freq: Four times a day (QID) | ORAL | Status: DC

## 2012-05-12 ENCOUNTER — Ambulatory Visit (AMBULATORY_SURGERY_CENTER): Payer: BC Managed Care – PPO | Admitting: *Deleted

## 2012-05-12 VITALS — Ht 65.75 in | Wt 122.8 lb

## 2012-05-12 DIAGNOSIS — A0472 Enterocolitis due to Clostridium difficile, not specified as recurrent: Secondary | ICD-10-CM

## 2012-05-13 SURGERY — Surgical Case
Anesthesia: *Unknown

## 2012-05-16 ENCOUNTER — Encounter (HOSPITAL_COMMUNITY): Payer: Self-pay | Admitting: *Deleted

## 2012-05-16 ENCOUNTER — Encounter (HOSPITAL_COMMUNITY)
Admission: RE | Disposition: A | Discharge status: Home / Self Care | Payer: Self-pay | Source: Ambulatory Visit | Attending: Internal Medicine

## 2012-05-16 ENCOUNTER — Ambulatory Visit (HOSPITAL_COMMUNITY)
Admission: RE | Admit: 2012-05-16 | Discharge: 2012-05-16 | Disposition: A | Discharge status: Home / Self Care | Payer: BC Managed Care – PPO | Source: Ambulatory Visit | Attending: Internal Medicine | Admitting: Internal Medicine

## 2012-05-16 DIAGNOSIS — R197 Diarrhea, unspecified: Secondary | ICD-10-CM | POA: Insufficient documentation

## 2012-05-16 DIAGNOSIS — F341 Dysthymic disorder: Secondary | ICD-10-CM | POA: Insufficient documentation

## 2012-05-16 DIAGNOSIS — A0472 Enterocolitis due to Clostridium difficile, not specified as recurrent: Secondary | ICD-10-CM

## 2012-05-16 DIAGNOSIS — K219 Gastro-esophageal reflux disease without esophagitis: Secondary | ICD-10-CM | POA: Insufficient documentation

## 2012-05-16 HISTORY — DX: Depression, unspecified: F32.A

## 2012-05-16 HISTORY — DX: Major depressive disorder, single episode, unspecified: F32.9

## 2012-05-16 HISTORY — DX: Other specified postprocedural states: R11.2

## 2012-05-16 HISTORY — DX: Other specified postprocedural states: Z98.890

## 2012-05-16 HISTORY — PX: COLONOSCOPY: SHX5424

## 2012-05-16 HISTORY — DX: Anxiety disorder, unspecified: F41.9

## 2012-05-16 SURGERY — COLONOSCOPY
Anesthesia: Moderate Sedation

## 2012-05-16 MED ORDER — DIPHENHYDRAMINE HCL 50 MG/ML IJ SOLN
INTRAMUSCULAR | Status: DC | PRN
  Administered 2012-05-16: 25 mg via INTRAVENOUS

## 2012-05-16 MED ORDER — FENTANYL CITRATE 0.05 MG/ML IJ SOLN
INTRAMUSCULAR | Status: AC
  Filled 2012-05-16: qty 2

## 2012-05-16 MED ORDER — FENTANYL CITRATE 0.05 MG/ML IJ SOLN
INTRAMUSCULAR | Status: DC | PRN
  Administered 2012-05-16 (×4): 25 ug via INTRAVENOUS

## 2012-05-16 MED ORDER — DIPHENHYDRAMINE HCL 50 MG/ML IJ SOLN
INTRAMUSCULAR | Status: AC
  Filled 2012-05-16: qty 1

## 2012-05-16 MED ORDER — LOPERAMIDE HCL 2 MG PO CAPS
4.0000 mg | ORAL_CAPSULE | Freq: Once | ORAL | Status: AC
  Administered 2012-05-16: 4 mg via ORAL
  Filled 2012-05-16: qty 2

## 2012-05-16 MED ORDER — SODIUM CHLORIDE 0.9 % IV SOLN
INTRAVENOUS | Status: DC
  Administered 2012-05-16: 500 mL via INTRAVENOUS

## 2012-05-16 MED ORDER — MIDAZOLAM HCL 5 MG/5ML IJ SOLN
INTRAMUSCULAR | Status: DC | PRN
  Administered 2012-05-16 (×5): 2 mg via INTRAVENOUS

## 2012-05-16 MED ORDER — MIDAZOLAM HCL 5 MG/ML IJ SOLN
INTRAMUSCULAR | Status: AC
  Filled 2012-05-16: qty 2

## 2012-05-16 NOTE — Op Note (Addendum)
[facility] [address] [place], [address]   COLONOSCOPY PROCEDURE REPORT  PATIENT: [name], [name]  MR#: [id number] BIRTHDATE: August 27, 1970 , 41  yrs. old GENDER: Female ENDOSCOPIST: Iva Boop, MD, [facility] REFERRED [id number] Drue Second, M.D. PROCEDURE DATE:  05/16/2012 PROCEDURE:   Colonoscopy, diagnostic ASA CLASS:   Class I INDICATIONS:chronic diarrhea. MEDICATIONS: Fentanyl 100 mcg IV, Versed 10 mg IV, and Diphenhydramine (Benadryl) 25 mg IV  DESCRIPTION OF PROCEDURE:   After the risks benefits and alternatives of the procedure were thoroughly explained, informed consent was obtained.  A digital rectal exam revealed no abnormalities of the rectum.   The Pentax Colonoscope [id number] endoscope was introduced through the anus and advanced to the terminal ileum which was intubated for a short distance. No adverse events experienced.   The quality of the prep was adequate, using MiraLax  The instrument was then slowly withdrawn as the colon was fully examined.      COLON FINDINGS: The colonic mucosa appeared normal throughout the entire examined colon but this was a limited exam as procedure was planned to administer fecal microbiotica transplant.  The mucosa appeared normal in the terminal ileum. At this point approximately 480 cc of fecal microbiotica transplant solution was injected into the terminal ielum and the scope was withrdrawn to the rectum. Retroflexed views revealed no abnormalities. ds.  e].  The scope was withdrawn and the procedure completed. COMPLICATIONS: There were no complications.  ENDOSCOPIC IMPRESSION: 1.   The colonic mucosa appeared normal throughout the entire examined colon - limited views as purpose was fecal microbiotica transplant. Cannot rely on this as a polyp/cancer screening or other diagnostic exam. 2.   Normal mucosa in the terminal ileum  RECOMMENDATIONS: 1.  Return to the care of your primary provider.   GI follow up as needed 2.   Let us or Dr.  Drue Second know if you develop recurrent diarrhea   eSigned:  Iva Boop, MD, [facility] 05/16/2012 1:35 PM Revised: 05/16/2012 1:35 PM  cc: The Patient and Gilmore Laroche MD

## 2012-05-16 NOTE — H&P (View-Only) (Signed)
Subjective:  Referred by Judyann Munson, MD   Patient ID: [name], female    DOB: Mar 13, 1971, 42 y.o.   MRN: [id number]  HPI This is a very nice 42yo white woman with a history of Cdifficile colitis in 2009-2010 with resolution finally after numerous treatments. On 03/18/2012 she  went to ED because of recurrent diarrhea that reminded her of C diff., (+ Cdiff pcr). Treatment  was first metronidazole which did not improve her symptoms, then subsequently changed to oral vancomycin. She had been seen in ID clinic on12/12/13 by Dr. Orvan Falconer, and was still having 5-6 loose stools, no significant improvement and changed to fidoxamicin. She is improving with less diarrhea but not yet resolved. The fidoxamicin was $900, she is getting help from Dr. Drue Second to hopefully get reimbursed due to metronidazole and vancomycin failure.  Originally had cdifficile in 2009- 2010. Initially thought had viral gastroenteritis, was eventually hospitalized since due to dehydration, and weight loss #10. Interestingly had no history of having recent antibiotics, this was roughly in May of 2010. She relapsed in oct 2010, had significant work up ( 3 addn colonoscopies). Treated by ID at [facility], Dr. Kerby Nora, vancomycin taper. Xifaxin, as well as fidoxamicin as a new drug at the time, where it finally relieved her symptoms. She was fine for 32yr and 3 months.This past June had uti, had 2 doses of Bactrim and stopped early due to concern for recurrence.  Mid November went to ED due to acute mucousy, nonbloody loose and watery stool where she was placed on flagyl sick to her stomach, but transitioned to vancomycin but still had 3-4 BM. Decision to transition to fidoxamicin. Still having 3-4 loose stools per day, but "mushy", no longer having mucous in her stool.Doesn't feel that vancomycin as effective as it was in 2010.  She has been on Florastor chronically since 2010.  Allergies  Allergen Reactions  . Codeine   .  Hydrocodone-Acetaminophen     REACTION: Palpitations  . Morphine     REACTION: Nausea  . Nitrofurantoin     REACTION: flu-like symtoms   Outpatient Prescriptions Prior to Visit  Medication Sig Dispense Refill  . citalopram (CELEXA) 10 MG tablet Take 10 mg by mouth daily.      . fidaxomicin (DIFICID) 200 MG TABS tablet Take 1 tablet (200 mg total) by mouth 2 (two) times daily.  20 tablet  0  . saccharomyces boulardii (FLORASTOR) 250 MG capsule Take 250 mg by mouth daily. Per PCP       . [DISCONTINUED] vancomycin (VANCOCIN) 250 MG capsule Take 250 mg by mouth 4 (four) times daily.       Last reviewed on 04/20/2012  9:15 AM by Bernita Buffy, CMA Past Medical History  Diagnosis Date  . C. difficile diarrhea   . Esophageal reflux   . Anxiety and depression    Past Surgical History  Procedure Date  . Lumbar fusion 1994, 1997    x 2, T11-L3  . Cesarean section   . Upper gastrointestinal endoscopy   . Colonoscopy    History   Social History  . Marital Status: Married    Spouse Name: N/A    Number of Children: 1  . 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 Smoking status: Never Smoker   . Smokeless tobacco: Never Used  . Alcohol Use: No  . Drug Use: No  . Sexually Active:  None   Other Topics Concern  . None   Social History Narrative  . None   Family History  Problem Relation Age of Onset  . Diabetes Mother   . Diabetes Father   . Lymphoma Paternal Grandmother    Review of Systems + anxiety, night sweats, insomnia and is menopausal. All other ROS negative except as per HPI    Objective:   Physical Exam General:  Well-developed, well-nourished and in no acute distress Eyes:  anicteric. ENT:   Mouth and posterior pharynx free of lesions.  Neck:   supple w/o thyromegaly or mass.  Lungs: Clear to auscultation bilaterally. Heart:  S1S2, no rubs, murmurs, gallops. Abdomen:  soft,  non-tender, no hepatosplenomegaly, hernia, or mass and BS+.  Lymph:  no cervical or supraclavicular adenopathy. Extremities:   no edema Skin   no rash. Neuro:  A&O x 3.  Psych:  appropriate mood and  Affect.   Data Reviewed: ID notes Previous colonoscopy and GI consult notes 2010    Assessment & Plan:   1. C. difficile colitis - recurrent    1. I agree that she is an appropriate candidate for fecal microbiotica transplant 2. Risks and benefits of colonoscopy-administered FMT explained to patient. She was given a copy of the IRB consent. 3. Anticipate FMT off fidoxamicin once brother screened. 4. She has MiraLax prep for colonoscopy instructions - will call her and fine tune those once we have date/time 5. She may try DanActive yogurt drink   ZO:[id number] TOM, MD and Judyann Munson, MD

## 2012-05-16 NOTE — Interval H&P Note (Signed)
History and Physical Interval Note:  05/16/2012 12:24 PM  [name]  has presented today for surgery, with the diagnosis of c-diff  The various methods of treatment have been discussed with the patient and family. After consideration of risks, benefits and other options for treatment, the patient has consented to  Procedure(s) (LRB) with comments: COLONOSCOPY (N/A) - fecal transplant as a surgical intervention .  The patient's history has been reviewed, patient examined, no change in status, stable for surgery.  I have reviewed the patient's chart and labs.  Questions were answered to the patient's satisfaction.     Stan Head, MD, [facility]

## 2012-05-17 ENCOUNTER — Encounter (HOSPITAL_COMMUNITY): Payer: Self-pay | Admitting: Internal Medicine

## 2012-06-01 ENCOUNTER — Ambulatory Visit: Payer: BC Managed Care – PPO | Admitting: Internal Medicine

## 2012-06-02 ENCOUNTER — Ambulatory Visit (INDEPENDENT_AMBULATORY_CARE_PROVIDER_SITE_OTHER): Payer: BC Managed Care – PPO | Admitting: Internal Medicine

## 2012-06-02 ENCOUNTER — Encounter: Payer: Self-pay | Admitting: Internal Medicine

## 2012-06-02 VITALS — BP 122/75 | HR 69 | Temp 98.1°F | Wt 118.0 lb

## 2012-06-02 DIAGNOSIS — A0472 Enterocolitis due to Clostridium difficile, not specified as recurrent: Secondary | ICD-10-CM

## 2012-06-02 NOTE — Progress Notes (Signed)
[facility] NOTE  RFV: follow up for fecal transplant for recurrent cdifficile infection Subjective:    Patient ID: [name], female    DOB: Apr 10, 1971, 42 y.o.   MRN: [id number]  HPI  [name] is a pleasant 42yo F with history of Cdifficile colitis in 2009-2010, had been disease free until this year. On 11/15 recurrence went to ED, (+ Cdiff pcr), she was first given flagyl which did not improve her symptoms, then subsequently transitioned to oral vanco. She had been seen in ID clinic on12/12 by Dr. Orvan Falconer, where still having 5-6 loose stools, no significant improvement and now switched to fidoxamicin.   Originally had cdifficile in 2009- 2010. Initially thought had viral gastroenteritis, was eventually hospitalized since due to dehydration, and weight loss #10. Interestingly had no history of having recent antibiotics, this was roughly in [place]y of 2010. She relapsed in oct 2010, had significant work up ( 3 addn colonoscopies). Treated by ID at [facility], Dr. Kerby Nora, vancomycin taper. Xifaxin, as well as fidoxamicin as a new drug at the time, where it finally relieved her symptoms. She was fine for 35yr and 3 months.This past June had uti, had 2 doses of bactrim and stopped early due to concern for recurrence.   Mid November went to ED due to acute mucousy, nonbloody loose and watery stool where she was diagnosed with recurrent cdifficile infection, started with oral vancomycin without improvement and then transitioned to fidoxamicin. 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 Leone Payor, brother was the stool donor.  She has had regular bowel movements now but occasionally mucosy. She is concerned that she still has cdifficile. She has no more than 2 BM a day, formed stool. No abdominal cramping. appetit is good.  Current Outpatient Prescriptions on File Prior to Visit  Medication Sig Dispense Refill  . citalopram (CELEXA) 10 MG tablet Take 10 mg by mouth daily.       No current  facility-administered medications on file prior to visit.     Review of Systems     Objective:   Physical Exam BP 122/75  Pulse 69  Temp(Src) 98.1 F (36.7 C) (Oral)  Wt 118 lb (53.524 kg)  BMI 19.19 kg/m2  LMP 04/25/2012 No exam at this time       Assessment & Plan:  Recurrent c.difficile infection, now resolved s/p fecal transplant = this visit is primarily for assurance of the patient. She is doing well overall. Fearful of having recurrence. She is almost back to her baseline bowel movements. No need to test for cdifficile at this time. We will have her come back if she has 3+ loose BM/ 24hrs.  25 min spent in counseling.

## 2012-07-21 ENCOUNTER — Other Ambulatory Visit: Payer: Self-pay | Admitting: Internal Medicine

## 2012-07-21 ENCOUNTER — Other Ambulatory Visit: Payer: BC Managed Care – PPO

## 2012-07-21 DIAGNOSIS — R300 Dysuria: Secondary | ICD-10-CM

## 2012-07-22 LAB — URINALYSIS, ROUTINE W REFLEX MICROSCOPIC
Glucose, UA: NEGATIVE mg/dL
Hgb urine dipstick: NEGATIVE
Leukocytes, UA: NEGATIVE
Nitrite: NEGATIVE
Protein, ur: NEGATIVE mg/dL
Urobilinogen, UA: 0.2 mg/dL (ref 0.0–1.0)

## 2012-07-23 LAB — URINE CULTURE: Colony Count: NO GROWTH

## 2012-08-29 ENCOUNTER — Encounter: Payer: Self-pay | Admitting: Physician Assistant

## 2012-08-29 ENCOUNTER — Ambulatory Visit (INDEPENDENT_AMBULATORY_CARE_PROVIDER_SITE_OTHER): Payer: BC Managed Care – PPO | Admitting: Physician Assistant

## 2012-08-29 VITALS — BP 104/70 | HR 92 | Temp 97.0°F | Resp 18 | Ht 64.0 in | Wt 119.0 lb

## 2012-08-29 DIAGNOSIS — B9689 Other specified bacterial agents as the cause of diseases classified elsewhere: Secondary | ICD-10-CM

## 2012-08-29 DIAGNOSIS — A499 Bacterial infection, unspecified: Secondary | ICD-10-CM

## 2012-08-29 DIAGNOSIS — J069 Acute upper respiratory infection, unspecified: Secondary | ICD-10-CM

## 2012-08-29 MED ORDER — FLUTICASONE PROPIONATE 50 MCG/ACT NA SUSP: 2.0000 | Freq: Every day | NASAL | Status: DC

## 2012-08-29 MED ORDER — AMOXICILLIN 875 MG PO TABS: 875.0000 mg | ORAL_TABLET | Freq: Two times a day (BID) | ORAL | Status: DC

## 2012-08-30 ENCOUNTER — Encounter: Payer: Self-pay | Admitting: Internal Medicine

## 2012-08-30 ENCOUNTER — Ambulatory Visit (INDEPENDENT_AMBULATORY_CARE_PROVIDER_SITE_OTHER): Payer: BC Managed Care – PPO | Admitting: Internal Medicine

## 2012-08-30 ENCOUNTER — Telehealth: Payer: Self-pay | Admitting: *Deleted

## 2012-08-30 VITALS — BP 123/76 | HR 80 | Temp 97.6°F | Wt 117.0 lb

## 2012-08-30 DIAGNOSIS — J322 Chronic ethmoidal sinusitis: Secondary | ICD-10-CM

## 2012-08-30 NOTE — Progress Notes (Signed)
   Patient ID: COREY CAULFIELD MRN: [id number], DOB: [birth year], 42 y.o. Date of Encounter: 08/30/2012, 8:47 AM    Chief Complaint:  Chief Complaint  Patient presents with  . bad allergies  9-10 days  runny nose, ear pressure,  congest    grren drainage     HPI: 42 y.o. year old female states she has been sick for 9-10 days. Nasal mucus is thick and green. Has cough secondary to drainage down throat-does not seem to be in chest. No sore throat, ear pain, fever, chills.  Has used netie pot.      Home Meds: Current Outpatient Prescriptions on File Prior to Visit  Medication Sig Dispense Refill  . citalopram (CELEXA) 10 MG tablet Take 10 mg by mouth daily.       No current facility-administered medications on file prior to visit.    Allergies:  Allergies  Allergen Reactions  . Codeine Nausea And Vomiting  . Hydrocodone-Acetaminophen     REACTION: Palpitations  . Morphine     REACTION: Nausea  . Nitrofurantoin     REACTION: flu-like symtoms      Review of Systems: See HPI for pertinent ros. Others negative.   Physical Exam: Blood pressure 104/70, pulse 92, temperature 97 F (36.1 C), temperature source Oral, resp. rate 18, height 5\' 4"  (1.626 m), weight 119 lb (53.978 kg), last menstrual period 08/22/2012., Body mass index is 20.42 kg/(m^2). General: Well developed, well nourished,WF in no acute distress. HEENT: Normocephalic, atraumatic, eyes without discharge, sclera non-icteric, nares are without discharge. Bilateral auditory canals clear, TM's are without perforation, pearly grey and translucent with reflective cone of light bilaterally. Oral cavity moist, posterior pharynx without exudate, erythema, peritonsillar abscess. Bilateral maxillary sinuses with minimal tenderness with percussion.   Neck: Supple. No lymphadenopathy. Lungs: Clear bilaterally to auscultation without wheezes, rales, or rhonchi. Breathing is unlabored. Heart: Regular rhythm. No murmurs, rubs, or  gallops. Msk:  Strength and tone normal for age. Extremities/Skin: Warm and dry. No clubbing or cyanosis. No edema. No rashes or suspicious lesions. Neuro: Alert and oriented X 3. Moves all extremities spontaneously. Gait is normal. CNII-XII grossly in tact. Psych:  Responds to questions appropriately with a normal affect.     ASSESSMENT AND PLAN:  42 y.o. year old female with  1. Bacterial upper respiratory infection  She reports that she has a h/o severe C.Diff colitis. Says this occurred 4 years ago but it persisted for 2 years. Says ID told her to never take antibiotics. I explained that she needs antibiotics to kill off current respiratory infection. She tells me that she is to inform her ID doctor. She is to do so. Discussed that I will avoid using a "strong" antibiotic. Will use Amoxicillin but if infection does not resolve with this, let me know, and will then have to use stronger abx. She is to take a probiotic as well as vitamin c and echinacea and will f/u with ID as well.  - amoxicillin (AMOXIL) 875 MG tablet; Take 1 tablet (875 mg total) by  mouth 2 (two) times daily.  Dispense: 20 tablet; Refill: 0 - fluticasone (FLONASE) 50 MCG/ACT nasal spray; Place 2 sprays into the nose daily.  Dispense: 16 g; Refill: [address] [place], [place], [facility] 08/30/2012 8:47 AM

## 2012-08-30 NOTE — Telephone Encounter (Signed)
Per Dr Drue Second called the patient and gave her an appt for today at 11 am to discuss antibiotics. She has a sinus infection and needs antibiotic but she is a recurrent CDiff patient.

## 2012-08-30 NOTE — Progress Notes (Signed)
[facility] NOTE  RFV: sinusitis Subjective:    Patient ID: [name], female    DOB: April 13, 1971, 42 y.o.   MRN: [id number]  HPI [name] is a 42yo F with history of recurrent c.difficile infection, she underwent an FMT via colonoscopy in mid January by carl gessner. She has been doing well since then. occassional loose stool but no sustained diarrhea. She presents to clinic due to having sinusitis for the past 10 days and her pcp is wanting her to take amoxicillin. She is concerned about having a recurrence of cdifficile if taking antibiotics  Nose started running 10 days ago thought it was due to seasonal allergies . Tried mucinex and claritin D, no NSAIDS presently, netty pot every other day. Started having green exudate, feels mucus is running down her throat. Has a tickle in her throat that is making her cough.   Current Outpatient Prescriptions on File Prior to Visit  Medication Sig Dispense Refill  . amoxicillin (AMOXIL) 875 MG tablet Take 1 tablet (875 mg total) by mouth 2 (two) times daily.  20 tablet  0  . citalopram (CELEXA) 10 MG tablet Take 10 mg by mouth daily.      . fluticasone (FLONASE) 50 MCG/ACT nasal spray Place 2 sprays into the nose daily.  16 g  6  . loratadine-pseudoephedrine (CLARITIN-D 12-HOUR) 5-120 MG per tablet Take 1 tablet by mouth 2 (two) times daily.       No current facility-administered medications on file prior to visit.   Active Ambulatory Problems    Diagnosis Date Noted  . ANXIETY DEPRESSION 01/28/2010  . POSITIONAL VERTIGO 06/14/2007  . ESOPHAGEAL REFLUX 01/13/2010  . PALPITATIONS 07/20/2006  . CHEST PAIN 07/20/2006  . DIARRHEA 12/31/2009  . CLOSTRIDIUM DIFFICILE COLITIS, HX OF 07/02/2009  . C. difficile colitis - recurrent 04/20/2012   Resolved Ambulatory Problems    Diagnosis Date Noted  . No Resolved Ambulatory Problems   Past Medical History  Diagnosis Date  . C. difficile diarrhea   . Anxiety and depression   . PONV (postoperative  nausea and vomiting)   . Anxiety   . Depression       Review of Systems     Objective:   Physical Exam BP 123/76  Pulse 80  Temp(Src) 97.6 F (36.4 C) (Oral)  Wt 117 lb (53.071 kg)  BMI 20.07 kg/m2  LMP 08/22/2012 Physical Exam  Constitutional: oriented to person, place, and time. appears well-developed and well-nourished. No distress.  HENT:  Mouth/Throat: Oropharynx is clear and moist. No oropharyngeal exudate. TM clear bilaterally. Nasal turbinates erythematous slightly edematous L>R. No conjunctivitis. No tenderness along maxillary sinuses Cardiovascular: Normal rate, regular rhythm and normal heart sounds. Exam reveals no gallop and no friction rub.  No murmur heard.  Pulmonary/Chest: Effort normal and breath sounds normal. No respiratory distress. He has no wheezes.  Lymphadenopathy: no cervical adenopathy.  Skin: Skin is warm and dry. No rash noted. No erythema.       Assessment & Plan:  Sinusitis = likely allergy vs. Viral trigger. recommend to hold off on starting amoxicillin. Would continue with claritin. Start taking ibuprofen 600mg  BID-TID and flonase with netty pot daily   Call back in 3 days to see if any improvement in symptoms

## 2012-09-01 ENCOUNTER — Other Ambulatory Visit: Payer: Self-pay | Admitting: Internal Medicine

## 2012-09-01 DIAGNOSIS — R05 Cough: Secondary | ICD-10-CM

## 2012-09-01 MED ORDER — BENZONATATE 200 MG PO CAPS: 200.0000 mg | ORAL_CAPSULE | Freq: Three times a day (TID) | ORAL | Status: DC | PRN

## 2012-10-27 ENCOUNTER — Other Ambulatory Visit: Payer: BC Managed Care – PPO

## 2012-10-27 ENCOUNTER — Other Ambulatory Visit: Payer: Self-pay | Admitting: Internal Medicine

## 2012-10-27 DIAGNOSIS — N3090 Cystitis, unspecified without hematuria: Secondary | ICD-10-CM

## 2012-10-27 LAB — URINALYSIS, ROUTINE W REFLEX MICROSCOPIC
Bilirubin Urine: NEGATIVE
Glucose, UA: NEGATIVE mg/dL
Ketones, ur: NEGATIVE mg/dL
Protein, ur: NEGATIVE mg/dL

## 2012-10-28 LAB — URINE CULTURE: Colony Count: NO GROWTH

## 2012-12-21 ENCOUNTER — Telehealth: Payer: Self-pay | Admitting: *Deleted

## 2012-12-21 NOTE — Telephone Encounter (Signed)
Patient called stating she has a yeast infection and was given fluconazole x 1. She wanted to be sure it was ok to take this in regards to her previous C-diff. Wendall Mola

## 2012-12-22 NOTE — Telephone Encounter (Signed)
Per Dr. Drue Second she has already contacted patient. [name]

## 2013-01-26 ENCOUNTER — Ambulatory Visit (INDEPENDENT_AMBULATORY_CARE_PROVIDER_SITE_OTHER): Payer: BC Managed Care – PPO | Admitting: *Deleted

## 2013-01-26 VITALS — Temp 97.8°F

## 2013-01-26 DIAGNOSIS — Z23 Encounter for immunization: Secondary | ICD-10-CM

## 2013-02-20 ENCOUNTER — Telehealth: Payer: Self-pay | Admitting: *Deleted

## 2013-02-20 NOTE — Telephone Encounter (Signed)
Called patient to try and schedule her an appt per Dr Drue Second. Had to leave a message for her to call the office.

## 2013-03-14 ENCOUNTER — Encounter: Payer: Self-pay | Admitting: Internal Medicine

## 2013-03-14 ENCOUNTER — Ambulatory Visit (INDEPENDENT_AMBULATORY_CARE_PROVIDER_SITE_OTHER): Payer: BC Managed Care – PPO | Admitting: Internal Medicine

## 2013-03-14 VITALS — BP 117/53 | HR 65 | Temp 98.1°F | Wt 122.0 lb

## 2013-03-14 DIAGNOSIS — R195 Other fecal abnormalities: Secondary | ICD-10-CM

## 2013-03-14 NOTE — Progress Notes (Signed)
[facility] NOTE  RFV: routine  Subjective:    Patient ID: [name], female    DOB: 1970/06/08, 42 y.o.   MRN: [id number]  HPI 42yo F with recurrent cdifficile s/p FMT in mid January 2014 who reports sporadically noticing mucous in her stool.  Mostly around her period. She states that her health has been good other than having been diagnosed pre-menopausal. Started menses at age of 42yo. She is having nightsweats, headache, heavy bleeding with her menses. GYN has checked her hormones levels. Due to seeing mucous in her stools, she is very anxious that her cdifficile is coming back. She states that she usually has a bout of diarrhea associated with her menses but it is limited to one day, on those days is when she notices the mucous.  Current Outpatient Prescriptions on File Prior to Visit  Medication Sig Dispense Refill  . citalopram (CELEXA) 10 MG tablet Take 10 mg by mouth daily.       No current facility-administered medications on file prior to visit.   Active Ambulatory Problems    Diagnosis Date Noted  . ANXIETY DEPRESSION 01/28/2010  . POSITIONAL VERTIGO 06/14/2007  . ESOPHAGEAL REFLUX 01/13/2010  . PALPITATIONS 07/20/2006  . CHEST PAIN 07/20/2006  . DIARRHEA 12/31/2009  . CLOSTRIDIUM DIFFICILE COLITIS, HX OF 07/02/2009  . C. difficile colitis - recurrent 04/20/2012   Resolved Ambulatory Problems    Diagnosis Date Noted  . No Resolved Ambulatory Problems   Past Medical History  Diagnosis Date  . C. difficile diarrhea   . Anxiety and depression   . PONV (postoperative nausea and vomiting)   . Anxiety   . Depression        Review of Systems 12 point ROS is negative. No diarrhea presently    Objective:   Physical Exam Gen= a x o by 4 in NAD Psych = anxious appearing HEENT = MMM, [place]/AT, PERRLA, EOMI        Assessment & Plan:   mucous in stool = appears related to her menses but since it is sporadic. Unlikely to be related to cdifficile. Reassured  her that she Is still doing ok. At this time will not check for cdifficile prcr since she is asymptomatic.  rtc PRN

## 2013-04-03 ENCOUNTER — Other Ambulatory Visit: Payer: Self-pay

## 2013-04-03 DIAGNOSIS — Z1231 Encounter for screening mammogram for malignant neoplasm of breast: Secondary | ICD-10-CM

## 2013-05-08 ENCOUNTER — Ambulatory Visit
Admission: RE | Admit: 2013-05-08 | Discharge: 2013-05-08 | Disposition: A | Discharge status: Home / Self Care | Payer: BC Managed Care – PPO | Source: Ambulatory Visit

## 2013-05-08 DIAGNOSIS — Z1231 Encounter for screening mammogram for malignant neoplasm of breast: Secondary | ICD-10-CM

## 2013-06-26 ENCOUNTER — Other Ambulatory Visit: Payer: Self-pay | Admitting: Family Medicine

## 2014-01-23 ENCOUNTER — Ambulatory Visit: Payer: BC Managed Care – PPO

## 2014-01-24 ENCOUNTER — Ambulatory Visit (INDEPENDENT_AMBULATORY_CARE_PROVIDER_SITE_OTHER): Payer: BC Managed Care – PPO | Admitting: *Deleted

## 2014-01-24 DIAGNOSIS — Z23 Encounter for immunization: Secondary | ICD-10-CM

## 2014-02-05 ENCOUNTER — Other Ambulatory Visit: Payer: Self-pay | Admitting: Family Medicine

## 2014-03-22 ENCOUNTER — Other Ambulatory Visit: Payer: Self-pay | Admitting: Family Medicine

## 2014-04-11 ENCOUNTER — Other Ambulatory Visit: Payer: Self-pay

## 2014-04-11 DIAGNOSIS — Z1231 Encounter for screening mammogram for malignant neoplasm of breast: Secondary | ICD-10-CM

## 2014-05-10 ENCOUNTER — Ambulatory Visit
Admission: RE | Admit: 2014-05-10 | Discharge: 2014-05-10 | Disposition: A | Discharge status: Home / Self Care | Payer: BLUE CROSS/BLUE SHIELD | Source: Ambulatory Visit

## 2014-05-10 DIAGNOSIS — Z1231 Encounter for screening mammogram for malignant neoplasm of breast: Secondary | ICD-10-CM

## 2014-07-02 ENCOUNTER — Other Ambulatory Visit: Payer: Self-pay | Admitting: Family Medicine

## 2014-07-02 NOTE — Telephone Encounter (Signed)
Refill denied.   Requires office visit before any further refills can be given.  

## 2014-07-17 ENCOUNTER — Ambulatory Visit: Payer: Self-pay | Admitting: Family Medicine

## 2014-07-24 ENCOUNTER — Ambulatory Visit (INDEPENDENT_AMBULATORY_CARE_PROVIDER_SITE_OTHER): Payer: BLUE CROSS/BLUE SHIELD | Admitting: Family Medicine

## 2014-07-24 ENCOUNTER — Encounter: Payer: Self-pay | Admitting: Family Medicine

## 2014-07-24 VITALS — BP 110/70 | HR 78 | Temp 98.0°F | Resp 18 | Ht 65.0 in | Wt 125.0 lb

## 2014-07-24 DIAGNOSIS — Z0000 Encounter for general adult medical examination without abnormal findings: Secondary | ICD-10-CM

## 2014-07-24 DIAGNOSIS — F329 Major depressive disorder, single episode, unspecified: Secondary | ICD-10-CM | POA: Diagnosis not present

## 2014-07-24 DIAGNOSIS — F32A Depression, unspecified: Secondary | ICD-10-CM

## 2014-07-24 MED ORDER — CITALOPRAM HYDROBROMIDE 20 MG PO TABS: 20.0000 mg | ORAL_TABLET | Freq: Every day | ORAL | Status: DC

## 2014-07-24 NOTE — Progress Notes (Signed)
Subjective:    Patient ID: Jillian NixonJanet Arias, female    DOB: 1970/07/11, 44 y.o.   MRN: [id number]  HPI Patient is here today for follow-up. She has a history of anxiety and depression. She is currently well controlled on Celexa 10 mg by mouth daily. She feels good on this dose. She is very concerned about weaning off the medication. She's been having a lot of hot flashes and night sweats and mood swings related to perimenopausal symptoms. Therefore she does not believe that now is the appropriate time to try to wean off the medication. She denies any suicidal ideation. She denies any panic attacks. She denies any manic symptoms. Overall she is very satisfied with the dose of the medication. Her colonoscopy is up-to-date. Her mammogram and Pap smear are both up-to-date. She is due for fasting lab work. Past Medical History  Diagnosis Date  . C. difficile diarrhea   . Esophageal reflux   . Anxiety and depression   . PONV (postoperative nausea and vomiting)   . Anxiety   . Depression    Past Surgical History  Procedure Laterality Date  . Lumbar fusion  1994, 1997    x 2, T11-L3  . Cesarean section    . Upper gastrointestinal endoscopy    . Colonoscopy    . Tonsillectomy    . Colonoscopy  05/16/2012    Procedure: COLONOSCOPY;  Surgeon: Iva Booparl E Gessner, MD;  [facility];  Service: Endoscopy;  Laterality: N/A;  fecal transplant   Current Outpatient Prescriptions on File Prior to Visit  Medication Sig Dispense Refill  . citalopram (CELEXA) 10 MG tablet Take 10 mg by mouth daily.     No current facility-administered medications on file prior to visit.   Allergies  Allergen Reactions  . Codeine Nausea And Vomiting  . Hydrocodone-Acetaminophen     REACTION: Palpitations  . Morphine     REACTION: Nausea  . Nitrofurantoin     REACTION: flu-like symtoms   History   Social History  . Marital Status: Married    Spouse Name: N/A  . Number of Children: 1  . Years of Education:  N/A   Occupational History  . Scientific laboratory technicianmanager [employer]   Social History Main Topics  . Smoking status: Never Smoker   . Smokeless tobacco: Never Used  . Alcohol Use: No  . Drug Use: No  . Sexual Activity: Not on file   Other Topics Concern  . Not on file   Social History Narrative      Review of Systems  All other systems reviewed and are negative.      Objective:   Physical Exam  Constitutional: She appears well-developed and well-nourished.  Neck: Neck supple. No thyromegaly present.  Cardiovascular: Normal rate, regular rhythm and normal heart sounds.   No murmur heard. Pulmonary/Chest: Effort normal and breath sounds normal. No respiratory distress. She has no wheezes. She has no rales.  Abdominal: Soft. Bowel sounds are normal. She exhibits no distension. There is no tenderness. There is no rebound.  Musculoskeletal: She exhibits no edema.  Lymphadenopathy:    She has no cervical adenopathy.  Vitals reviewed.         Assessment & Plan:  Routine general medical examination at a health care facility - Plan: citalopram (CELEXA) 20 MG tablet, COMPLETE METABOLIC PANEL WITH GFR, CBC with Differential/Platelet, Lipid panel, TSH  Depression  We will continue Celexa 10 mg by mouth daily. I will give her the 20 mg  tablets and she can break them in half and take a half a tablet a day. I've also asked the patient to return fasting so that I can check a CBC, CMP, fasting lipid panel, and TSH just as part of her routine general medical checkup.

## 2014-11-15 ENCOUNTER — Encounter: Payer: Self-pay | Admitting: Family Medicine

## 2014-11-15 ENCOUNTER — Other Ambulatory Visit: Payer: Self-pay | Admitting: Family Medicine

## 2014-11-15 NOTE — Telephone Encounter (Signed)
Medication refill for one time only.  Patient needs to be seen.  Letter sent for patient to call and schedule.  Pt has never returned for her post CPE lab work.

## 2014-11-21 ENCOUNTER — Other Ambulatory Visit: Payer: BLUE CROSS/BLUE SHIELD

## 2014-11-21 DIAGNOSIS — Z0000 Encounter for general adult medical examination without abnormal findings: Secondary | ICD-10-CM

## 2014-11-21 LAB — COMPLETE METABOLIC PANEL WITH GFR
ALBUMIN: 4.1 g/dL (ref 3.5–5.2)
ALT: 20 U/L (ref 0–35)
AST: 28 U/L (ref 0–37)
Alkaline Phosphatase: 42 U/L (ref 39–117)
BUN: 14 mg/dL (ref 6–23)
CO2: 28 mEq/L (ref 19–32)
Calcium: 9.8 mg/dL (ref 8.4–10.5)
Chloride: 104 mEq/L (ref 96–112)
Creat: 0.7 mg/dL (ref 0.50–1.10)
GLUCOSE: 82 mg/dL (ref 70–99)
Potassium: 5.1 mEq/L (ref 3.5–5.3)
SODIUM: 140 meq/L (ref 135–145)
Total Bilirubin: 0.5 mg/dL (ref 0.2–1.2)
Total Protein: 6.9 g/dL (ref 6.0–8.3)

## 2014-11-21 LAB — CBC WITH DIFFERENTIAL/PLATELET
BASOS PCT: 1 % (ref 0–1)
Basophils Absolute: 0 10*3/uL (ref 0.0–0.1)
EOS ABS: 0.1 10*3/uL (ref 0.0–0.7)
EOS PCT: 2 % (ref 0–5)
HEMATOCRIT: 40.2 % (ref 36.0–46.0)
Hemoglobin: 13.2 g/dL (ref 12.0–15.0)
LYMPHS ABS: 1.4 10*3/uL (ref 0.7–4.0)
LYMPHS PCT: 38 % (ref 12–46)
MCH: 29 pg (ref 26.0–34.0)
MCHC: 32.8 g/dL (ref 30.0–36.0)
MCV: 88.4 fL (ref 78.0–100.0)
MONOS PCT: 9 % (ref 3–12)
MPV: 10.3 fL (ref 8.6–12.4)
Monocytes Absolute: 0.3 10*3/uL (ref 0.1–1.0)
Neutro Abs: 1.8 10*3/uL (ref 1.7–7.7)
Neutrophils Relative %: 50 % (ref 43–77)
PLATELETS: 251 10*3/uL (ref 150–400)
RBC: 4.55 MIL/uL (ref 3.87–5.11)
RDW: 13.5 % (ref 11.5–15.5)
WBC: 3.6 10*3/uL — ABNORMAL LOW (ref 4.0–10.5)

## 2014-11-21 LAB — LIPID PANEL
Cholesterol: 204 mg/dL — ABNORMAL HIGH (ref 0–200)
HDL: 60 mg/dL (ref >=46)
LDL Cholesterol: 130 mg/dL — ABNORMAL HIGH (ref 0–99)
Total CHOL/HDL Ratio: 3.4 Ratio
Triglycerides: 68 mg/dL (ref <150)
VLDL: 14 mg/dL (ref 0–40)

## 2014-11-21 LAB — TSH: TSH: 1.415 u[IU]/mL (ref 0.350–4.500)

## 2014-11-22 ENCOUNTER — Other Ambulatory Visit: Payer: BLUE CROSS/BLUE SHIELD

## 2014-11-22 ENCOUNTER — Encounter: Payer: Self-pay | Admitting: Family Medicine

## 2014-12-17 ENCOUNTER — Other Ambulatory Visit: Payer: Self-pay | Admitting: Family Medicine

## 2015-01-15 ENCOUNTER — Ambulatory Visit (INDEPENDENT_AMBULATORY_CARE_PROVIDER_SITE_OTHER): Payer: BLUE CROSS/BLUE SHIELD | Admitting: *Deleted

## 2015-01-15 DIAGNOSIS — Z23 Encounter for immunization: Secondary | ICD-10-CM | POA: Diagnosis not present

## 2015-04-08 ENCOUNTER — Other Ambulatory Visit: Payer: Self-pay

## 2015-04-08 DIAGNOSIS — Z1231 Encounter for screening mammogram for malignant neoplasm of breast: Secondary | ICD-10-CM

## 2015-05-14 ENCOUNTER — Ambulatory Visit: Payer: BLUE CROSS/BLUE SHIELD

## 2015-05-21 ENCOUNTER — Ambulatory Visit
Admission: RE | Admit: 2015-05-21 | Discharge: 2015-05-21 | Disposition: A | Discharge status: Home / Self Care | Payer: BLUE CROSS/BLUE SHIELD | Source: Ambulatory Visit

## 2015-05-21 DIAGNOSIS — Z1231 Encounter for screening mammogram for malignant neoplasm of breast: Secondary | ICD-10-CM

## 2015-05-24 ENCOUNTER — Ambulatory Visit (INDEPENDENT_AMBULATORY_CARE_PROVIDER_SITE_OTHER): Payer: BLUE CROSS/BLUE SHIELD | Admitting: Family Medicine

## 2015-05-24 ENCOUNTER — Ambulatory Visit
Admission: RE | Admit: 2015-05-24 | Discharge: 2015-05-24 | Disposition: A | Discharge status: Home / Self Care | Payer: BLUE CROSS/BLUE SHIELD | Source: Ambulatory Visit | Attending: Family Medicine | Admitting: Family Medicine

## 2015-05-24 ENCOUNTER — Encounter: Payer: Self-pay | Admitting: Family Medicine

## 2015-05-24 VITALS — BP 108/62 | HR 80 | Temp 98.2°F | Resp 14 | Wt 123.0 lb

## 2015-05-24 DIAGNOSIS — R0789 Other chest pain: Secondary | ICD-10-CM

## 2015-05-24 NOTE — Progress Notes (Signed)
   Subjective:    Patient ID: [name], female    DOB: 13-Dec-1970, 45 y.o.   MRN: [id number]  HPI   patient presents with one-week of pain in her right chest. Pain radiates from her axilla underneath her breasts to the center of her chest. She is exquisitely tender to palpation over the ribs in her axilla which then radiates under her breast and her chest. She has been coughing recently with an upper respiratory infection. She denies any fever. She denies any hemoptysis. She has some mild shortness of breath but it is very mild. She has no risk factors for pulmonary embolism. She is on no hormone replacement therapy or birth control. She's had no recent surgeries. She has had no  Immobilization. She has no family history of DVT or PE. Past Medical History  Diagnosis Date  . C. difficile diarrhea   . Esophageal reflux   . Anxiety and depression   . PONV (postoperative nausea and vomiting)   . Anxiety   . Depression    Past Surgical History  Procedure Laterality Date  . Lumbar fusion  1994, 1997    x 2, T11-L3  . Cesarean section    . Upper gastrointestinal endoscopy    . Colonoscopy    . Tonsillectomy    . Colonoscopy  05/16/2012    Procedure: COLONOSCOPY;  Surgeon: Iva Boop, MD;  Location[facility] ENDOSCOPY;  Service: Endoscopy;  Laterality: N/A;  fecal transplant   Current Outpatient Prescriptions on File Prior to Visit  Medication Sig Dispense Refill  . citalopram (CELEXA) 10 MG tablet Take 10 mg by mouth daily.     No current facility-administered medications on file prior to visit.   Allergies  Allergen Reactions  . Codeine Nausea And Vomiting  . Hydrocodone-Acetaminophen     REACTION: Palpitations  . Morphine     REACTION: Nausea  . Nitrofurantoin     REACTION: flu-like symtoms   Social History   Social History  . Marital Status: Married    Spouse Name: N/A  . Number of Children: 1  . Years of Education: N/A   Occupational History  . [employer]   Social History Main Topics  . Smoking status: Never Smoker   . Smokeless tobacco: Never Used  . Alcohol Use: No  . Drug Use: No  . Sexual Activity: Not on file   Other Topics Concern  . Not on file   Social History Narrative    Review of Systems  All other systems reviewed and are negative.      Objective:   Physical Exam  Constitutional: She appears well-developed and well-nourished.  Cardiovascular: Normal rate, regular rhythm and normal heart sounds.   No murmur heard. Pulmonary/Chest: Effort normal. No respiratory distress. She has no wheezes. She has no rales. She exhibits tenderness.  Abdominal: Bowel sounds are normal. She exhibits no distension. There is no tenderness.  Vitals reviewed.         Assessment & Plan:  Other chest pain - Plan: DG Chest 2 View, DG Ribs Unilateral Right   I suspect the patient has cracked a rib from coughing. I will obtain a chest x-ray to rule out pneumonia and also rib x-rays to evaluate for rib fracture. I do not believe this is a muscle strain given her tenderness to palpation over the ribs.

## 2015-05-28 ENCOUNTER — Encounter: Payer: BLUE CROSS/BLUE SHIELD | Admitting: Family Medicine

## 2015-05-28 NOTE — Progress Notes (Signed)
   Subjective:    Patient ID: [name], female    DOB: 03-Oct-1970, 45 y.o.   MRN: [id number]  HPI  05/24/15 Patient presents with one-week of pain in her right chest. Pain radiates from her axilla underneath her breasts to the center of her chest. She is exquisitely tender to palpation over the ribs in her axilla which then radiates under her breast and her chest. She has been coughing recently with an upper respiratory infection. She denies any fever. She denies any hemoptysis. She has some mild shortness of breath but it is very mild. She has no risk factors for pulmonary embolism. She is on no hormone replacement therapy or birth control. She's had no recent surgeries. She has had no  Immobilization. She has no family history of DVT or PE.  At that time, my plan was:  I suspect the patient has cracked a rib from coughing. I will obtain a chest x-ray to rule out pneumonia and also rib x-rays to evaluate for rib fracture. I do not believe this is a muscle strain given her tenderness to palpation over the ribs.  05/28/15 CXR and rib films were negative.   Past Medical History  Diagnosis Date  . C. difficile diarrhea   . Esophageal reflux   . Anxiety and depression   . PONV (postoperative nausea and vomiting)   . Anxiety   . Depression    Past Surgical History  Procedure Laterality Date  . Lumbar fusion  1994, 1997    x 2, T11-L3  . Cesarean section    . Upper gastrointestinal endoscopy    . Colonoscopy    . Tonsillectomy    . Colonoscopy  05/16/2012    Procedure: COLONOSCOPY;  Surgeon: Iva Boop, MD;  Location: [facility] ENDOSCOPY;  Service: Endoscopy;  Laterality: N/A;  fecal transplant   Current Outpatient Prescriptions on File Prior to Visit  Medication Sig Dispense Refill  . citalopram (CELEXA) 10 MG tablet Take 10 mg by mouth daily.     No current facility-administered medications on file prior to visit.   Allergies  Allergen Reactions  . Codeine Nausea And Vomiting  .  Hydrocodone-Acetaminophen     REACTION: Palpitations  . Morphine     REACTION: Nausea  . Nitrofurantoin     REACTION: flu-like symtoms   Social History   Social History  . Marital Status: Married    Spouse Name: N/A  . Number of Children: 1  . 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 Smoking status: Never Smoker   . Smokeless tobacco: Never Used  . Alcohol Use: No  . Drug Use: No  . Sexual Activity: Not on file   Other Topics Concern  . Not on file   Social History Narrative    Review of Systems  All other systems reviewed and are negative.      Objective:   Physical Exam  Constitutional: She appears well-developed and well-nourished.  Cardiovascular: Normal rate, regular rhythm and normal heart sounds.   No murmur heard. Pulmonary/Chest: Effort normal. No respiratory distress. She has no wheezes. She has no rales. She exhibits tenderness.  Abdominal: Bowel sounds are normal. She exhibits no distension. There is no tenderness.  Vitals reviewed.         Assessment & Plan:   This encounter was created in error - please disregard.

## 2015-07-29 ENCOUNTER — Other Ambulatory Visit: Payer: Self-pay | Admitting: Family Medicine

## 2015-07-29 NOTE — Telephone Encounter (Signed)
Refill appropriate and filled per protocol. 

## 2015-11-19 ENCOUNTER — Other Ambulatory Visit: Payer: Self-pay | Admitting: Family Medicine

## 2016-01-21 ENCOUNTER — Ambulatory Visit (INDEPENDENT_AMBULATORY_CARE_PROVIDER_SITE_OTHER): Payer: BLUE CROSS/BLUE SHIELD

## 2016-01-21 DIAGNOSIS — Z23 Encounter for immunization: Secondary | ICD-10-CM | POA: Diagnosis not present

## 2016-04-14 ENCOUNTER — Other Ambulatory Visit: Payer: Self-pay | Admitting: Obstetrics and Gynecology

## 2016-04-14 DIAGNOSIS — Z1231 Encounter for screening mammogram for malignant neoplasm of breast: Secondary | ICD-10-CM

## 2016-04-24 ENCOUNTER — Encounter: Payer: BLUE CROSS/BLUE SHIELD | Admitting: Family Medicine

## 2016-05-04 DIAGNOSIS — R002 Palpitations: Secondary | ICD-10-CM

## 2016-05-04 HISTORY — DX: Palpitations: R00.2

## 2016-05-10 ENCOUNTER — Other Ambulatory Visit: Payer: Self-pay | Admitting: Family Medicine

## 2016-05-21 ENCOUNTER — Ambulatory Visit: Payer: BLUE CROSS/BLUE SHIELD

## 2016-06-16 ENCOUNTER — Ambulatory Visit: Payer: BLUE CROSS/BLUE SHIELD

## 2016-06-16 DIAGNOSIS — N938 Other specified abnormal uterine and vaginal bleeding: Secondary | ICD-10-CM | POA: Diagnosis not present

## 2016-06-16 DIAGNOSIS — N83292 Other ovarian cyst, left side: Secondary | ICD-10-CM | POA: Diagnosis not present

## 2016-07-08 ENCOUNTER — Ambulatory Visit
Admission: RE | Admit: 2016-07-08 | Discharge: 2016-07-08 | Disposition: A | Discharge status: Home / Self Care | Payer: BLUE CROSS/BLUE SHIELD | Source: Ambulatory Visit | Attending: Obstetrics and Gynecology | Admitting: Obstetrics and Gynecology

## 2016-07-08 DIAGNOSIS — Z1231 Encounter for screening mammogram for malignant neoplasm of breast: Secondary | ICD-10-CM | POA: Diagnosis not present

## 2016-07-10 DIAGNOSIS — Z85828 Personal history of other malignant neoplasm of skin: Secondary | ICD-10-CM | POA: Diagnosis not present

## 2016-08-31 ENCOUNTER — Ambulatory Visit (INDEPENDENT_AMBULATORY_CARE_PROVIDER_SITE_OTHER): Payer: BLUE CROSS/BLUE SHIELD | Admitting: Family Medicine

## 2016-08-31 ENCOUNTER — Encounter: Payer: Self-pay | Admitting: Family Medicine

## 2016-08-31 VITALS — BP 126/80 | HR 76 | Temp 98.0°F | Resp 14 | Ht 66.0 in | Wt 122.0 lb

## 2016-08-31 DIAGNOSIS — R002 Palpitations: Secondary | ICD-10-CM

## 2016-08-31 DIAGNOSIS — I493 Ventricular premature depolarization: Secondary | ICD-10-CM

## 2016-08-31 LAB — COMPREHENSIVE METABOLIC PANEL
AG RATIO: 1.6 ratio (ref 1.0–2.5)
ALBUMIN: 4.2 g/dL (ref 3.6–5.1)
ALK PHOS: 44 U/L (ref 33–115)
ALT: 14 U/L (ref 6–29)
AST: 20 U/L (ref 10–35)
BILIRUBIN TOTAL: 0.4 mg/dL (ref 0.2–1.2)
BUN/Creatinine Ratio: 18.5 Ratio (ref 6–22)
BUN: 15 mg/dL (ref 7–25)
CO2: 25 mmol/L (ref 20–31)
Calcium: 9.7 mg/dL (ref 8.6–10.2)
Chloride: 107 mmol/L (ref 98–110)
Creat: 0.81 mg/dL (ref 0.50–1.10)
GFR, EST NON AFRICAN AMERICAN: 87 mL/min (ref >=60)
GFR, Est African American: >89 mL/min (ref >=60)
GLOBULIN: 2.7 g/dL (ref 1.9–3.7)
Glucose, Bld: 74 mg/dL (ref 70–99)
Potassium: 5.5 mmol/L — ABNORMAL HIGH (ref 3.5–5.3)
Sodium: 141 mmol/L (ref 135–146)
TOTAL PROTEIN: 6.9 g/dL (ref 6.1–8.1)

## 2016-08-31 LAB — CBC WITH DIFFERENTIAL/PLATELET
BASOS ABS: 51 {cells}/uL (ref 0–200)
Basophils Relative: 1 %
Eosinophils Absolute: 51 cells/uL (ref 15–500)
Eosinophils Relative: 1 %
HCT: 40.9 % (ref 35.0–45.0)
HEMOGLOBIN: 13.4 g/dL (ref 12.0–15.0)
LYMPHS ABS: 1224 {cells}/uL (ref 850–3900)
LYMPHS PCT: 24 %
MCH: 29.7 pg (ref 27.0–33.0)
MCHC: 32.8 g/dL (ref 32.0–36.0)
MCV: 90.7 fL (ref 80.0–100.0)
MPV: 10.2 fL (ref 7.5–12.5)
Monocytes Absolute: 408 cells/uL (ref 200–950)
Monocytes Relative: 8 %
NEUTROS PCT: 66 %
Neutro Abs: 3366 cells/uL (ref 1500–7800)
Platelets: 257 10*3/uL (ref 140–400)
RBC: 4.51 MIL/uL (ref 3.80–5.10)
RDW: 13.6 % (ref 11.0–15.0)
WBC: 5.1 10*3/uL (ref 3.8–10.8)

## 2016-08-31 LAB — TSH: TSH: 2.12 m[IU]/L

## 2016-08-31 MED ORDER — METOPROLOL SUCCINATE ER 25 MG PO TB24: 25.0000 mg | ORAL_TABLET | Freq: Every day | ORAL | 3 refills | Status: DC

## 2016-08-31 NOTE — Progress Notes (Signed)
   Subjective:    Patient ID: [name], female    DOB: [birth year], 46 y.o.   MRN: [id number]  HPI Over the last couple weeks, the patient has been reporting palpitations. They're brief and self terminating. She reports feeling like skipped heartbeats. She's not been sleeping well and is under increasing anxiety. She denies any chest pain or shortness of breath. EKG today reveals inverted P waves as well as an occasional PVC. However there is no evidence of ischemia or infarction.  Past Medical History:  Diagnosis Date  . Anxiety   . Anxiety and depression   . C. difficile diarrhea   . Depression   . Esophageal reflux   . PONV (postoperative nausea and vomiting)    Past Surgical History:  Procedure Laterality Date  . AUGMENTATION MAMMAPLASTY Bilateral 2006   Saline, In front of the muscle   . CESAREAN SECTION    . COLONOSCOPY    . COLONOSCOPY  05/16/2012   Procedure: COLONOSCOPY;  Surgeon: Iva Boop, MD;  Location: [facility] ENDOSCOPY;  Service: Endoscopy;  Laterality: N/A;  fecal transplant  . LUMBAR FUSION  1994, 1997   x 2, T11-L3  . TONSILLECTOMY    . UPPER GASTROINTESTINAL ENDOSCOPY     No current outpatient prescriptions on file prior to visit.   No current facility-administered medications on file prior to visit.    Allergies  Allergen Reactions  . Codeine Nausea And Vomiting  . Hydrocodone-Acetaminophen     REACTION: Palpitations  . Morphine     REACTION: Nausea  . Nitrofurantoin     REACTION: flu-like symtoms   Social History   Social History  . Marital status: Married    Spouse name: N/A  . Number of children: 1  . Years of education: N/A   Occupational History  . [employer]   Social History Main Topics  . Smoking status: Never Smoker  . Smokeless tobacco: Never Used  . Alcohol use No  . Drug use: No  . Sexual activity: Not on file   Other Topics Concern  . Not on file   Social History Narrative  . No narrative on file     Review of Systems  All other systems reviewed and are negative.      Objective:   Physical Exam  Constitutional: She appears well-developed and well-nourished.  Cardiovascular: Normal rate, regular rhythm and normal heart sounds.   No murmur heard. Pulmonary/Chest: Effort normal. No respiratory distress. She has no wheezes. She has no rales. She exhibits no tenderness.  Abdominal: Bowel sounds are normal. She exhibits no distension. There is no tenderness.  Vitals reviewed.         Assessment & Plan:  Fluttering sensation of heart - Plan: EKG 12-Lead, CBC with Differential/Platelet, COMPLETE METABOLIC PANEL WITH GFR, TSH, metoprolol succinate (TOPROL-XL) 25 MG 24 hr tablet  PVC (premature ventricular contraction)  EKG demonstrates a PVC. Her history is consistent with PVCs. I will check a CBC, CMP, TSH. Start the patient on Toprol-XL 25 mg by mouth daily and recheck in one week. Discussed with the patient minimizing stress in her life and trying to improve sleep and avoiding stimulants. If symptoms persist, obtain echocardiogram to evaluate for valvular dysfunction or atrial enlargement which may possibly explain the inverted P waves

## 2016-09-01 ENCOUNTER — Telehealth: Payer: Self-pay | Admitting: Family Medicine

## 2016-09-01 DIAGNOSIS — R002 Palpitations: Secondary | ICD-10-CM

## 2016-09-01 NOTE — Telephone Encounter (Signed)
Blood work not back yet will call when it comes back.

## 2016-09-01 NOTE — Telephone Encounter (Signed)
Patient calling to speak with you regarding her bloodwork and a test that dr pickard had mentioned to her yesterday  [phone]

## 2016-09-01 NOTE — Telephone Encounter (Signed)
Patient aware of results and Echo ordered

## 2016-09-02 ENCOUNTER — Telehealth: Payer: Self-pay | Admitting: Family Medicine

## 2016-09-02 NOTE — Telephone Encounter (Signed)
Patient is calling to get a rx for xanax called in if possible for what she was seen for yesterday if possible

## 2016-09-02 NOTE — Telephone Encounter (Signed)
Called to tell patient about Echocardiogram appt for this Monday.  Pt says she is having a lot of anxiety and can she have some Xanax until all this over?

## 2016-09-03 ENCOUNTER — Other Ambulatory Visit: Payer: Self-pay

## 2016-09-03 ENCOUNTER — Ambulatory Visit (HOSPITAL_COMMUNITY): Payer: BLUE CROSS/BLUE SHIELD | Attending: Cardiology

## 2016-09-03 DIAGNOSIS — I340 Nonrheumatic mitral (valve) insufficiency: Secondary | ICD-10-CM | POA: Insufficient documentation

## 2016-09-03 DIAGNOSIS — R002 Palpitations: Secondary | ICD-10-CM

## 2016-09-03 DIAGNOSIS — I358 Other nonrheumatic aortic valve disorders: Secondary | ICD-10-CM | POA: Diagnosis not present

## 2016-09-03 MED ORDER — ALPRAZOLAM 0.5 MG PO TABS: 0.5000 mg | ORAL_TABLET | Freq: Three times a day (TID) | ORAL | 0 refills | Status: DC | PRN

## 2016-09-03 NOTE — Telephone Encounter (Signed)
Xanax 0.5 mg poq 8 hrs prn anxiety. 30 

## 2016-09-03 NOTE — Telephone Encounter (Signed)
Medication called/sent to requested pharmacy  

## 2016-09-04 ENCOUNTER — Encounter: Payer: Self-pay | Admitting: Family Medicine

## 2016-09-07 ENCOUNTER — Other Ambulatory Visit (HOSPITAL_COMMUNITY): Payer: BLUE CROSS/BLUE SHIELD

## 2016-09-07 ENCOUNTER — Telehealth: Payer: Self-pay | Admitting: Family Medicine

## 2016-09-07 NOTE — Telephone Encounter (Signed)
Pt states that she is still having palpitations and would like to see a cardiologist. Rip Harbourk to refer.

## 2016-09-07 NOTE — Telephone Encounter (Signed)
I would recommend a holter monitor to see what they are first.

## 2016-09-08 NOTE — Telephone Encounter (Signed)
Patient [facility] of results and of providers recommendations [facility] Will call pt when it arrives to come in and have placed.

## 2016-09-09 ENCOUNTER — Other Ambulatory Visit: Payer: Self-pay

## 2016-09-09 ENCOUNTER — Emergency Department (HOSPITAL_COMMUNITY): Payer: BLUE CROSS/BLUE SHIELD

## 2016-09-09 ENCOUNTER — Emergency Department (HOSPITAL_COMMUNITY)
Admission: EM | Admit: 2016-09-09 | Discharge: 2016-09-09 | Disposition: A | Discharge status: Home / Self Care | Payer: BLUE CROSS/BLUE SHIELD | Attending: Emergency Medicine | Admitting: Emergency Medicine

## 2016-09-09 ENCOUNTER — Ambulatory Visit: Payer: BLUE CROSS/BLUE SHIELD | Admitting: Family Medicine

## 2016-09-09 ENCOUNTER — Encounter (HOSPITAL_COMMUNITY): Payer: Self-pay | Admitting: Emergency Medicine

## 2016-09-09 DIAGNOSIS — R002 Palpitations: Secondary | ICD-10-CM | POA: Diagnosis not present

## 2016-09-09 DIAGNOSIS — I493 Ventricular premature depolarization: Secondary | ICD-10-CM | POA: Diagnosis not present

## 2016-09-09 DIAGNOSIS — R079 Chest pain, unspecified: Secondary | ICD-10-CM | POA: Diagnosis not present

## 2016-09-09 LAB — BASIC METABOLIC PANEL
Anion gap: 7 (ref 5–15)
BUN: 10 mg/dL (ref 6–20)
CALCIUM: 9.3 mg/dL (ref 8.9–10.3)
CO2: 27 mmol/L (ref 22–32)
CREATININE: 0.71 mg/dL (ref 0.44–1.00)
Chloride: 105 mmol/L (ref 101–111)
GFR calc non Af Amer: >60 mL/min (ref >60)
Glucose, Bld: 98 mg/dL (ref 65–99)
Potassium: 4.2 mmol/L (ref 3.5–5.1)
SODIUM: 139 mmol/L (ref 135–145)

## 2016-09-09 LAB — CBC
HCT: 39.7 % (ref 36.0–46.0)
Hemoglobin: 13 g/dL (ref 12.0–15.0)
MCH: 29.6 pg (ref 26.0–34.0)
MCHC: 32.7 g/dL (ref 30.0–36.0)
MCV: 90.4 fL (ref 78.0–100.0)
PLATELETS: 226 10*3/uL (ref 150–400)
RBC: 4.39 MIL/uL (ref 3.87–5.11)
RDW: 13 % (ref 11.5–15.5)
WBC: 4.9 10*3/uL (ref 4.0–10.5)

## 2016-09-09 LAB — I-STAT TROPONIN, ED: TROPONIN I, POC: 0 ng/mL (ref 0.00–0.08)

## 2016-09-09 LAB — TSH: TSH: 1.667 u[IU]/mL (ref 0.350–4.500)

## 2016-09-09 NOTE — ED Triage Notes (Signed)
Pt states she has been feeling like her heart has been fluttering off and on for two weeks. Pt was seen at her MD and they did an echo which appeared normal. Pt here today bc the fluttering started last night and has not stopped. Pt denies feeling lightheaded or dizzy.

## 2016-09-09 NOTE — ED Provider Notes (Signed)
[facility]-EMERGENCY DEPT Provider Note   CSN: [id number] Arrival date & time: 09/09/16  4098     History   Chief Complaint No chief complaint on file.   HPI [name] is a 46 y.o. female. Chief complaint is palpitations.  HPI: This is a healthy 46 year old female who reports 2 weeks of intermittent episodes of palpitations. She describes is his isolated feelings of a movement in her chest" like her heart stops or beats extra".  She saw her primary care physician. A normal echocardiogram was obtained. She had "normal labs". Unknown TSH. Was given Xanax for sleep. Was given metoprolol to take at night or with symptoms.  Take Xanax and has slept better. She has not used metoprolol. Started having "a lot of them" today. She becomes very anxious when they happen and thus presents here. She has no associated chest pain, shortness of breath, lightheadedness, dizziness, syncope/presyncope.  No family history of heart disease. No history of hypertension, diabetes, hypercholesterolemia. Does not smoke. No excessive caffeine. No history of weight loss, night sweats, anxiety above her norm or other hyperthyroid symptoms.  Past Medical History:  Diagnosis Date  . Anxiety   . Anxiety and depression   . C. difficile diarrhea   . Depression   . Esophageal reflux   . PONV (postoperative nausea and vomiting)     Patient Active Problem List   Diagnosis Date Noted  . 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 ANXIETY DEPRESSION 01/28/2010  . ESOPHAGEAL REFLUX 01/13/2010  . DIARRHEA 12/31/2009  . CLOSTRIDIUM DIFFICILE COLITIS, HX OF 07/02/2009  . POSITIONAL VERTIGO 06/14/2007  . PALPITATIONS 07/20/2006  . CHEST PAIN 07/20/2006    Past Surgical History:  Procedure Laterality Date  . AUGMENTATION MAMMAPLASTY Bilateral 2006   Saline, In front of the muscle   . CESAREAN SECTION    . COLONOSCOPY    . COLONOSCOPY  05/16/2012   Procedure: COLONOSCOPY;  Surgeon: Iva Boop, MD;   Location: [facility] ENDOSCOPY;  Service: Endoscopy;  Laterality: N/A;  fecal transplant  . LUMBAR FUSION  1994, 1997   x 2, T11-L3  . TONSILLECTOMY    . UPPER GASTROINTESTINAL ENDOSCOPY      OB History    No data available       Home Medications    Prior to Admission medications   Medication Sig Start Date End Date Taking? Authorizing Provider  ALPRAZolam Prudy Feeler) 0.5 MG tablet Take 1 tablet (0.5 mg total) by mouth every 8 (eight) hours as needed for anxiety. 09/03/16   Donita Brooks, MD  metoprolol succinate (TOPROL-XL) 25 MG 24 hr tablet Take 1 tablet (25 mg total) by mouth daily. 08/31/16   Donita Brooks, MD    Family History Family History  Problem Relation Age of Onset  . Diabetes Mother   . Diabetes Father   . Lymphoma Paternal Grandmother   . Colon cancer Neg Hx   . Stomach cancer Neg Hx     Social History Social History  Substance Use Topics  . Smoking status: Never Smoker  . Smokeless tobacco: Never Used  . Alcohol use No     Allergies   Codeine; Hydrocodone-acetaminophen; Morphine; and Nitrofurantoin   Review of Systems Review of Systems  Constitutional: Negative for appetite change, chills, diaphoresis, fatigue and fever.  HENT: Negative for mouth sores, sore throat and trouble swallowing.   Eyes: Negative for visual disturbance.  Respiratory: Negative for cough, chest tightness, shortness of breath and wheezing.   Cardiovascular: Positive  for palpitations. Negative for chest pain.  Gastrointestinal: Negative for abdominal distention, abdominal pain, diarrhea, nausea and vomiting.  Endocrine: Negative for polydipsia, polyphagia and polyuria.  Genitourinary: Negative for dysuria, frequency and hematuria.  Musculoskeletal: Negative for gait problem.  Skin: Negative for color change, pallor and rash.  Neurological: Negative for dizziness, syncope, light-headedness and headaches.  Hematological: Does not bruise/bleed easily.  Psychiatric/Behavioral:  Negative for behavioral problems and confusion. The patient is nervous/anxious.      Physical Exam Updated Vital Signs BP (!) 117/59 (BP Location: Left Arm)   Pulse 71   Temp 98.1 F (36.7 C) (Oral)   Resp 16   Ht 5\' 6"  (1.676 m)   Wt 120 lb (54.4 kg)   LMP 09/01/2016   SpO2 99%   BMI 19.37 kg/m   Physical Exam  Constitutional: She is oriented to person, place, and time. She appears well-developed and well-nourished. No distress.  HENT:  Head: Normocephalic.  Eyes: Conjunctivae are normal. Pupils are equal, round, and reactive to light. No scleral icterus.  Neck: Normal range of motion. Neck supple. No thyromegaly present.  Cardiovascular: Normal rate and regular rhythm.  Exam reveals no gallop and no friction rub.   No murmur heard. Normal rhythm. Has occasional PVCs on the monitor. She is aware of these. These exactly reproduce her symptoms.  Pulmonary/Chest: Effort normal and breath sounds normal. No respiratory distress. She has no wheezes. She has no rales.  Abdominal: Soft. Bowel sounds are normal. She exhibits no distension. There is no tenderness. There is no rebound.  Musculoskeletal: Normal range of motion.  Neurological: She is alert and oriented to person, place, and time.  Skin: Skin is warm and dry. No rash noted.  Psychiatric: She has a normal mood and affect. Her behavior is normal.     ED Treatments / Results  Labs (all labs ordered are listed, but only abnormal results are displayed) Labs Reviewed  BASIC METABOLIC PANEL  CBC  TSH  I-STAT TROPOININ, ED    EKG  EKG Interpretation  Date/Time:  Wednesday Sep 09 2016 08:47:29 EDT Ventricular Rate:  78 PR Interval:  138 QRS Duration: 88 QT Interval:  392 QTC Calculation: 446 R Axis:   -70 Text Interpretation:  Sinus rhythm with sinus arrhythmia with occasional Premature ventricular complexes Biatrial enlargement Left axis deviation Low voltage QRS Possible Inferior infarct , age undetermined Cannot  rule out Anterior infarct , age undetermined Abnormal ECG Confirmed by Fayrene Fearing  MD, Letesha Klecker ([id number]) on 09/09/2016 9:15:02 AM       Radiology Dg Chest 2 View  Result Date: 09/09/2016 CLINICAL DATA:  Chest pain EXAM: CHEST  2 VIEW COMPARISON:  May 24, 2015 FINDINGS: Lungs are clear. Heart size and pulmonary vascularity are normal. No adenopathy. No pneumothorax. No bone lesions. IMPRESSION: No edema or consolidation. Electronically Signed   By: Bretta Bang III M.D.   On: 09/09/2016 09:24    Procedures Procedures (including critical care time)  Medications Ordered in ED Medications - No data to display   Initial Impression / Assessment and Plan / ED Course  I have reviewed the triage vital signs and the nursing notes.  Pertinent labs & imaging results that were available during my care of the patient were reviewed by me and considered in my medical decision making (see chart for details).    Labs normal. Pending TSH. Appropriate for discharge home. Reassurance given. Endocrinology referral for 24 Holter monitor to ensure that these are simply and only PVCs.  Advised her to take her Xanax as needed for sleep and use of metoprolol at night.   Final Clinical Impressions(s) / ED Diagnoses   Final diagnoses:  PVC's (premature ventricular contractions)    New Prescriptions New Prescriptions   No medications on file     Rolland PorterJames, Kailena Lubas, MD 09/09/16 1020

## 2016-09-09 NOTE — Discharge Instructions (Signed)
Your palpitations are caused by premature contractions. These are benign. Avoid alcohol, stimulant such as caffeine or cold medicines. Xanax as needed  for sleep. Metoprolol as prescribed by your physician at night to decrease the PVCs.

## 2016-09-09 NOTE — ED Notes (Signed)
Pt taken to Xray.

## 2016-09-09 NOTE — ED Notes (Signed)
Phlebotomy at the bedside  

## 2016-09-09 NOTE — ED Notes (Signed)
Patient transported to X-ray 

## 2016-09-09 NOTE — Patient Instructions (Signed)
Holter monitor place on pt and instructions given to pt and pt verbalized understanding of these instructions.

## 2016-09-10 ENCOUNTER — Ambulatory Visit (INDEPENDENT_AMBULATORY_CARE_PROVIDER_SITE_OTHER): Payer: BLUE CROSS/BLUE SHIELD | Admitting: Family Medicine

## 2016-09-10 ENCOUNTER — Encounter: Payer: Self-pay | Admitting: Family Medicine

## 2016-09-10 VITALS — BP 108/66 | HR 80 | Temp 98.3°F | Resp 14 | Ht 66.0 in | Wt 123.0 lb

## 2016-09-10 DIAGNOSIS — I493 Ventricular premature depolarization: Secondary | ICD-10-CM

## 2016-09-10 NOTE — Patient Instructions (Signed)
Pt returned Holter Monitor placed yesterday.  No diary returned.  [employer] called for pick up tracking # [id number]  Order # [id number]

## 2016-09-10 NOTE — Progress Notes (Signed)
Subjective:    Patient ID: [name], female    DOB: 03-28-1971, 46 y.o.   MRN: [id number]  HPI  08/31/16 Over the last couple weeks, the patient has been reporting palpitations. They're brief and self terminating. She reports feeling like skipped heartbeats. She's not been sleeping well and is under increasing anxiety. She denies any chest pain or shortness of breath. EKG today reveals inverted P waves as well as an occasional PVC. However there is no evidence of ischemia or infarction.  At that time, my plan was: EKG demonstrates a PVC. Her history is consistent with PVCs. I will check a CBC, CMP, TSH. Start the patient on Toprol-XL 25 mg by mouth daily and recheck in one week. Discussed with the patient minimizing stress in her life and trying to improve sleep and avoiding stimulants. If symptoms persist, obtain echocardiogram to evaluate for valvular dysfunction or atrial enlargement which may possibly explain the inverted P waves  09/10/16 Ultimately, the patient called us back and wanted us to schedule the echocardiogram which we did. Echocardiogram revealed no valvular abnormalities or structural abnormalities in the heart. Holter monitor was placed and she returned that yesterday. She was having so many palpitations that were she went to the emergency room yesterday and was diagnosed with PVCs. They repeated the lab work that we have previously drawn and again it was normal. Of note the patient never started the Toprol-XL 25 mg a day. She has been using Xanax to sleep at night and she is sleeping well without difficulty but essentially she wakes up, the symptoms returned. She seems extremely anxious today. She is scheduled to see the cardiologist Tuesday of next week by ER physician.  She reports 50 PVCs in the 30 minutes it took her to drive to my clinic this afternoon  Past Medical History:  Diagnosis Date  . Anxiety   . Anxiety and depression   . C. difficile diarrhea   .  Depression   . Esophageal reflux   . PONV (postoperative nausea and vomiting)    Past Surgical History:  Procedure Laterality Date  . AUGMENTATION MAMMAPLASTY Bilateral 2006   Saline, In front of the muscle   . CESAREAN SECTION    . COLONOSCOPY    . COLONOSCOPY  05/16/2012   Procedure: COLONOSCOPY;  Surgeon: Iva Booparl E Gessner, MD;  [facility];  Service: Endoscopy;  Laterality: N/A;  fecal transplant  . LUMBAR FUSION  1994, 1997   x 2, T11-L3  . TONSILLECTOMY    . UPPER GASTROINTESTINAL ENDOSCOPY     Current Outpatient Prescriptions on File Prior to Visit  Medication Sig Dispense Refill  . ALPRAZolam (XANAX) 0.5 MG tablet Take 1 tablet (0.5 mg total) by mouth every 8 (eight) hours as needed for anxiety. 30 tablet 0  . metoprolol succinate (TOPROL-XL) 25 MG 24 hr tablet Take 1 tablet (25 mg total) by mouth daily. (Patient not taking: Reported on 09/10/2016) 90 tablet 3   No current facility-administered medications on file prior to visit.    Allergies  Allergen Reactions  . Codeine Nausea And Vomiting  . Hydrocodone-Acetaminophen     REACTION: Palpitations  . Morphine     REACTION: Nausea  . Nitrofurantoin     REACTION: flu-like symtoms   Social History   Social History  . Marital status: Married    Spouse name: N/A  . Number of children: 1  . Years of education: N/A   Occupational History  . Production designer, theatre/television/filmmanager  [employer]   Social History Main Topics  . Smoking status: Never Smoker  . Smokeless tobacco: Never Used  . Alcohol use No  . Drug use: No  . Sexual activity: Not on file   Other Topics Concern  . Not on file   Social History Narrative  . No narrative on file    Review of Systems  All other systems reviewed and are negative.      Objective:   Physical Exam  Constitutional: She appears well-developed and well-nourished.  Cardiovascular: Normal rate, regular rhythm and normal heart sounds.   No murmur heard. Pulmonary/Chest: Effort  normal. No respiratory distress. She has no wheezes. She has no rales. She exhibits no tenderness.  Abdominal: Bowel sounds are normal. She exhibits no distension. There is no tenderness.  Vitals reviewed.         Assessment & Plan:  PVC (premature ventricular contraction)  Await the results of Holter monitor however I believe this is an anxiety driven phenomenon I believe that she's become so hypervigilant focusing on the PVCs is actually making it worse. I explained this at length with the patient. I begged her to try the metoprolol. I believe I can decrease the frequency of the PVCs with metoprolol, this will provide her some reassurance which then would hopefully help address her anxiety and the cycle would hopefully "snowball" in a positive fashion.  Furthermore if she takes metoprolol, this would provide her information she can share with the cardiologist on Tuesday regarding its effectiveness in case further interventions are considered. Also discussed the patient possibly changing citalopram to try to better manage her anxiety but she seemed resistant to this at the present time

## 2016-09-15 ENCOUNTER — Ambulatory Visit (INDEPENDENT_AMBULATORY_CARE_PROVIDER_SITE_OTHER): Payer: BLUE CROSS/BLUE SHIELD | Admitting: Cardiovascular Disease

## 2016-09-15 ENCOUNTER — Encounter: Payer: Self-pay | Admitting: Cardiovascular Disease

## 2016-09-15 VITALS — BP 116/68 | HR 88 | Ht 66.0 in | Wt 120.0 lb

## 2016-09-15 DIAGNOSIS — R002 Palpitations: Secondary | ICD-10-CM | POA: Diagnosis not present

## 2016-09-15 DIAGNOSIS — F419 Anxiety disorder, unspecified: Secondary | ICD-10-CM

## 2016-09-15 DIAGNOSIS — Z13220 Encounter for screening for lipoid disorders: Secondary | ICD-10-CM

## 2016-09-15 DIAGNOSIS — I491 Atrial premature depolarization: Secondary | ICD-10-CM

## 2016-09-15 DIAGNOSIS — I493 Ventricular premature depolarization: Secondary | ICD-10-CM | POA: Diagnosis not present

## 2016-09-15 NOTE — Progress Notes (Signed)
Cardiology Office Note    Date:  09/22/2016   ID:  [name], DOB 10-22-1970, MRN [id number]  PCP:  Susy Frizzle, MD  Cardiologist:  Shelva Majestic, MD   Chief Complaint  Patient presents with  . New Patient (Initial Visit)   Cardiology consultation through the referral of Dr. Jenna Luo for evaluation of palpitations.  History of Present Illness:  [name] is a 46 y.o. female who is referred through the courtesy of Dr. Jenna Luo for cardiology consultation of recurrent episodes of palpitations.  [name] denies any known history of cardiac disease.  She had recently started to spirits episodes of palpitations which would be short-lived.  She would feel the sensation in her throat.  She ultimately went to the emergency room because of increasing symptomatology on May 9 with at that time a 2 week history of this sensation.  She was anxious.  She was noted to have occasional PVCs on the monitor which she was aware of exactly reproduced her symptoms.  Her ECG showed sinus rhythm with sinus arrhythmia with occasional PVCs.  She has been evaluated by her primary physician, Dr. Jenna Luo.  She underwent a 2-D echo Doppler study on 09/03/2016 which had showed an ejection fraction of 55-60%.  She had normal diastolic parameters.  There were no regional wall motion abnormalities.  She did not have any significant valvular abnormalities.  She had been started on Toprol-XL but had not been taking this.  She was advised to reduce stress and improve sleep and avoid stimulants.  She subsequently underwent a Holter monitor study through Dr. Dennard Arias on 09/09/2016.  This reportedly showed an average heart rate at 75 bpm.  Her minimum heart rate was 46 at 4:47 AM while sleeping and maximum heart rate was 126 at 7:26 AM.  She had PACs and PVCs.  She did not have any prolonged pauses.  She isn't not been using caffeine.  When often she would experience a PVC.  She weekends stressed and  no more PVCs.  She denies any exertional symptoms.  She exercises at least 4 days per week.  There is no history of tobacco use.  She does not drink alcohol.  She presents for evaluation.     Past Medical History:  Diagnosis Date  . Anxiety   . Anxiety and depression   . C. difficile diarrhea   . Depression   . Esophageal reflux   . PONV (postoperative nausea and vomiting)     Past Surgical History:  Procedure Laterality Date  . AUGMENTATION MAMMAPLASTY Bilateral 2006   Saline, In front of the muscle   . CESAREAN SECTION    . COLONOSCOPY    . COLONOSCOPY  05/16/2012   Procedure: COLONOSCOPY;  Surgeon: Gatha Mayer, MD;  Location: [place];  Service: Endoscopy;  Laterality: N/A;  fecal transplant  . LUMBAR FUSION  1994, 1997   x 2, T11-L3  . TONSILLECTOMY    . UPPER GASTROINTESTINAL ENDOSCOPY      Current Medications: Outpatient Medications Prior to Visit  Medication Sig Dispense Refill  . ALPRAZolam (XANAX) 0.5 MG tablet Take 1 tablet (0.5 mg total) by mouth every 8 (eight) hours as needed for anxiety. 30 tablet 0  . metoprolol succinate (TOPROL-XL) 25 MG 24 hr tablet Take 1 tablet (25 mg total) by mouth daily. (Patient not taking: Reported on 09/15/2016) 90 tablet 3   No facility-administered medications prior to visit.      Allergies:  Codeine; Hydrocodone-acetaminophen; Morphine; and Nitrofurantoin   Social History   Social History  . Marital status: Married    Spouse name: N/A  . Number of children: 1  . Years of education: N/A   Occupational History  . [employer]   Social History Main Topics  . Smoking status: Never Smoker  . Smokeless tobacco: Never Used  . Alcohol use No  . Drug use: No  . Sexual activity: Not Asked   Other Topics Concern  . None   Social History Narrative  . None    She completed 12th grade of education.  She is an Glass blower/designer for [employer].  She is married for 23 years.  She has  one child.  There is no tobacco history.  Family History:  The patient's family history includes Diabetes in her father and mother; Hypertension in her sister; Lymphoma in her paternal grandmother; Stroke in her sister.  Both parents are alive.  She has a brother age 37 and a sister age 67, who had a stroke and hypertension.  ROS General: Negative; No fevers, chills, or night sweats;  HEENT: Negative; No changes in vision or hearing, sinus congestion, difficulty swallowing Pulmonary: Negative; No cough, wheezing, shortness of breath, hemoptysis Cardiovascular: Negative; No chest pain, presyncope, syncope, palpitations GI: Negative; No nausea, vomiting, diarrhea, or abdominal pain GU: Negative; No dysuria, hematuria, or difficulty voiding Musculoskeletal: Negative; no myalgias, joint pain, or weakness Hematologic/Oncology: Negative; no easy bruising, bleeding Endocrine: Negative; no heat/cold intolerance; no diabetes Neuro: Negative; no changes in balance, headaches Skin: Negative; No rashes or skin lesions Psychiatric: Negative; No behavioral problems, depression Sleep: Negative; No snoring, daytime sleepiness, hypersomnolence, bruxism, restless legs, hypnogognic hallucinations, no cataplexy Other comprehensive 14 point system review is negative.   PHYSICAL EXAM:   VS:  BP 116/68   Pulse 88   Ht '5\' 6"'  (1.676 m)   Wt 120 lb (54.4 kg)   LMP 09/01/2016   BMI 19.37 kg/m    Wt Readings from Last 3 Encounters:  09/15/16 120 lb (54.4 kg)  09/10/16 123 lb (55.8 kg)  09/09/16 120 lb (54.4 kg)    General: Alert, oriented, no distress.  Skin: normal turgor, no rashes, warm and dry HEENT: Normocephalic, atraumatic. Pupils equal round and reactive to light; sclera anicteric; extraocular muscles intact; Fundi Normal Nose without nasal septal hypertrophy Mouth/Parynx benign; Mallinpatti scale2 Neck: No JVD, no carotid bruits; normal carotid upstroke Lungs: clear to ausculatation and  percussion; no wheezing or rales Chest wall: without tenderness to palpitation Heart: PMI not displaced, RRR, s1 s2 normal, 1/6 systolic murmur, no diastolic murmur, no rubs, gallops, thrills, or heaves Abdomen: soft, nontender; no hepatosplenomehaly, BS+; abdominal aorta nontender and not dilated by palpation. Back: no CVA tenderness Pulses 2+ Musculoskeletal: full range of motion, normal strength, no joint deformities Extremities: no clubbing cyanosis or edema, Homan's sign negative  Neurologic: grossly nonfocal; Cranial nerves grossly wnl Psychologic: Normal mood and affect   Studies/Labs Reviewed:   EKG:  EKG is ordered today.  ECG (independently read by me): Low atrial rhythm with inverted P waves inferiorly, mild RV conduction delay, left anterior hemiblock.  QTc interval 467 ms.  I also personally reviewed the ECG from 09/09/2016 independently from her emergency room evaluation: This revealed sinus rhythm with mild sinus arrhythmia.  She had upright T waves inferiorly.  There was probable left anterior hemiblock.  Mild RV conduction delay.  Recent Labs: BMP Latest Ref Rng & Units  09/09/2016 08/31/2016 11/21/2014  Glucose 65 - 99 mg/dL 98 74 82  BUN 6 - 20 mg/dL '10 15 14  ' Creatinine 0.44 - 1.00 mg/dL 0.71 0.81 0.70  BUN/Creat Ratio 6 - 22 Ratio - 18.5 -  Sodium 135 - 145 mmol/L 139 141 140  Potassium 3.5 - 5.1 mmol/L 4.2 5.5(H) 5.1  Chloride 101 - 111 mmol/L 105 107 104  CO2 22 - 32 mmol/L '27 25 28  ' Calcium 8.9 - 10.3 mg/dL 9.3 9.7 9.8     Hepatic Function Latest Ref Rng & Units 08/31/2016 11/21/2014 03/18/2012  Total Protein 6.1 - 8.1 g/dL 6.9 6.9 7.2  Albumin 3.6 - 5.1 g/dL 4.2 4.1 4.1  AST 10 - 35 U/L '20 28 21  ' ALT 6 - 29 U/L '14 20 11  ' Alk Phosphatase 33 - 115 U/L 44 42 43  Total Bilirubin 0.2 - 1.2 mg/dL 0.4 0.5 0.5    CBC Latest Ref Rng & Units 09/09/2016 08/31/2016 11/21/2014  WBC 4.0 - 10.5 K/uL 4.9 5.1 3.6(L)  Hemoglobin 12.0 - 15.0 g/dL 13.0 13.4 13.2  Hematocrit 36.0  - 46.0 % 39.7 40.9 40.2  Platelets 150 - 400 K/uL 226 257 251   Lab Results  Component Value Date   MCV 90.4 09/09/2016   MCV 90.7 08/31/2016   MCV 88.4 11/21/2014   Lab Results  Component Value Date   TSH 1.667 09/09/2016   No results found for: HGBA1C   BNP No results found for: BNP  ProBNP No results found for: PROBNP   Lipid Panel     Component Value Date/Time   CHOL 204 (H) 11/21/2014 0836   TRIG 68 11/21/2014 0836   HDL 60 11/21/2014 0836   CHOLHDL 3.4 11/21/2014 0836   VLDL 14 11/21/2014 0836   LDLCALC 130 (H) 11/21/2014 0836     RADIOLOGY: Dg Chest 2 View  Result Date: 09/09/2016 CLINICAL DATA:  Chest pain EXAM: CHEST  2 VIEW COMPARISON:  May 24, 2015 FINDINGS: Lungs are clear. Heart size and pulmonary vascularity are normal. No adenopathy. No pneumothorax. No bone lesions. IMPRESSION: No edema or consolidation. Electronically Signed   By: Lowella Grip III M.D.   On: 09/09/2016 09:24     Additional studies/ records that were reviewed today include:  I reviewed the records from [facility] family medicine , and her [facility] emergency room evaluation I reviewed the patient's echo Doppler study and Holter monitor.    ASSESSMENT:    1. PVC (premature ventricular contraction)   2. Intermittent palpitations   3. Premature atrial contractions   4. Need for lipid screening   5. Anxiety      PLAN:  [name] is a 46 year old female who has experienced increasing episodes of palpitations over the past month.  She has been documented to have PVCs on her ER presentation.  Her echo.  Her Holter monitor evaluation has shown sinus rhythm with PACs and PVCs, but no high-grade ectopy.  She has discontinued any caffeine use.  These have been stress mediated.  Her echo Doppler study has not demonstrated any significant structural heart disease with normal chamber dimensions and normal systolic and diastolic function without wall motion abnormalities.  She  did not have any atrial dilatation.  She has been noted at times to have a low atrial rhythm on ECG.  She has not had any episodes of PSVT or ectopic atrial tachycardia.  There is no anginal type symptomatology.  I have reviewed her recent laboratory electrolytes  were normal.  TSH was normal.  In 2016.  She was hyperlipidemic and had an LDL cholesterol of 1:30 with a total cholesterol of 204.  This has not been reevaluated since.  Presently, I have recommended initiation of Toprol and since she has not been taking this.  We'll start 12.5 mg for 4 days and then increase to 25 mg daily.  Her blood pressure today is stable and not elevated.  I recommended avoidance of all caffeine or pseudoephedrine like preparations.  I have recommended exercise and discussed. reassurance that her symptoms were not ischemia mediated and that she does not have any structural heart disease.  I will see her in the office in 3 months for cardiology reevaluation.   Medication Adjustments/Labs and Tests Ordered: Current medicines are reviewed at length with the patient today.  Concerns regarding medicines are outlined above.  Medication changes, Labs and Tests ordered today are listed in the Patient Instructions below.  Patient Instructions  Medication Instructions:  START TOPROL AT 1/2 DAILY FOR 4 DAYS AND THEN INCREASE TO A FULL TABLET DAILY AS RECOMMENDED    Labwork: FASTING LP/CMET AT THE OFFICE. CALL A FEW DAYS BEFORE YOU WANT TO COME TO THE OFFICE [phone] TO SEE IF LAB APPOINTMENT NEEDED   Testing/Procedures: NONE  Follow-Up: Your physician recommends that you schedule a follow-up appointment in: [place]   If you need a refill on your cardiac medications before your next appointment, please call your pharmacy.     Signed, Shelva Majestic, MD , [facility] 09/22/2016 8:53 AM    [place] [address], [place], [place], [place]  [address] Phone: [phone]

## 2016-09-15 NOTE — Patient Instructions (Signed)
Medication Instructions:  START TOPROL AT 1/2 DAILY FOR 4 DAYS AND THEN INCREASE TO A FULL TABLET DAILY AS RECOMMENDED    Labwork: FASTING LP/CMET AT THE OFFICE. CALL A FEW DAYS BEFORE YOU WANT TO COME TO THE OFFICE [phone] TO SEE IF LAB APPOINTMENT NEEDED   Testing/Procedures: NONE  Follow-Up: Your physician recommends that you schedule a follow-up appointment in: 3 MONTH OV   If you need a refill on your cardiac medications before your next appointment, please call your pharmacy.

## 2016-09-16 ENCOUNTER — Telehealth: Payer: Self-pay | Admitting: Cardiovascular Disease

## 2016-09-16 NOTE — Telephone Encounter (Signed)
Error

## 2016-09-17 ENCOUNTER — Telehealth: Payer: Self-pay | Admitting: Cardiovascular Disease

## 2016-09-17 ENCOUNTER — Telehealth: Payer: Self-pay | Admitting: Family Medicine

## 2016-09-17 NOTE — Telephone Encounter (Signed)
Per Dr. Tanya NonesPickard holter monitor shows PAC's & PVC's but no dangerous arrhythmias. Patient aware of results and results faxed to Dr. Tresa EndoKelly.

## 2016-09-17 NOTE — Telephone Encounter (Signed)
New message    Pt is calling stating that her PCP sent over her monitor results and pt wants to talk about them with Dr. Tresa EndoKelly.

## 2016-09-17 NOTE — Telephone Encounter (Signed)
Returned call to patient advised we never received monitor report.PCP Dr.Pickard's called they will refax monitor report to fax # [phone].

## 2016-09-17 NOTE — Telephone Encounter (Signed)
We did receive monitor from Dr.Pickard's office.[facility] review.

## 2016-09-18 ENCOUNTER — Ambulatory Visit: Payer: BLUE CROSS/BLUE SHIELD | Admitting: Cardiovascular Disease

## 2016-09-22 ENCOUNTER — Encounter: Payer: Self-pay | Admitting: Cardiovascular Disease

## 2016-09-22 DIAGNOSIS — L570 Actinic keratosis: Secondary | ICD-10-CM | POA: Diagnosis not present

## 2016-09-22 NOTE — Telephone Encounter (Signed)
F/u Message  Pt call requesting to speak with RN about monitor results. Please call back to discuss

## 2016-09-23 ENCOUNTER — Telehealth: Payer: Self-pay | Admitting: Cardiovascular Disease

## 2016-09-23 ENCOUNTER — Encounter: Payer: Self-pay | Admitting: Cardiovascular Disease

## 2016-09-23 NOTE — Telephone Encounter (Signed)
Patient sent MyChart message requesting call for holter monitor results.  Per [facility], Dr. Caren MacadamPickard's office notified patient of results (see below) She states the results were faxed to our office last week and she was under the impression that Dr. Tresa EndoKelly would review and call her. Will forward to Dr. Tresa EndoKelly and Burna MortimerWanda, CMA  Legrand RamsWillis, Sandy B  09/17/16 9:13 AM  Note    Per Dr. Tanya NonesPickard holter monitor shows PAC's & PVC's but no dangerous arrhythmias. Patient aware of results and results faxed to Dr. Tresa EndoKelly.

## 2016-10-05 ENCOUNTER — Encounter: Payer: Self-pay | Admitting: Family Medicine

## 2016-10-05 MED ORDER — ALPRAZOLAM 0.5 MG PO TABS: 0.5000 mg | ORAL_TABLET | Freq: Three times a day (TID) | ORAL | 2 refills | Status: DC | PRN

## 2016-10-05 NOTE — Telephone Encounter (Signed)
Requesting refill on Xanax - Ok to refill??      LOV 09/10/16 LRF - 09/03/16

## 2016-10-05 NOTE — Telephone Encounter (Signed)
Medication called/sent to requested pharmacy  

## 2016-10-07 NOTE — Telephone Encounter (Signed)
This encounter has been routed to MD. Awaiting his result review.

## 2016-10-09 NOTE — Telephone Encounter (Signed)
Patient called and notified that Dr Tresa Endokelly agrees with the interpretation given to her by Dr Tanya NonesPickard.

## 2016-10-09 NOTE — Telephone Encounter (Signed)
Patient notified Dr Tresa Endokelly agrees with Dr Felisa BonierPickards interpretation of her monitor.

## 2016-11-11 ENCOUNTER — Other Ambulatory Visit: Payer: Self-pay | Admitting: Family Medicine

## 2016-11-17 DIAGNOSIS — Z681 Body mass index (BMI) 19 or less, adult: Secondary | ICD-10-CM | POA: Diagnosis not present

## 2016-11-17 DIAGNOSIS — Z01419 Encounter for gynecological examination (general) (routine) without abnormal findings: Secondary | ICD-10-CM | POA: Diagnosis not present

## 2016-12-09 ENCOUNTER — Other Ambulatory Visit: Payer: Self-pay | Admitting: Family Medicine

## 2016-12-15 ENCOUNTER — Ambulatory Visit: Payer: BLUE CROSS/BLUE SHIELD | Admitting: Cardiovascular Disease

## 2017-01-10 ENCOUNTER — Other Ambulatory Visit: Payer: Self-pay | Admitting: Family Medicine

## 2017-01-19 ENCOUNTER — Other Ambulatory Visit: Payer: Self-pay

## 2017-01-19 MED ORDER — ALPRAZOLAM 0.5 MG PO TABS: 0.5000 mg | ORAL_TABLET | Freq: Three times a day (TID) | ORAL | 2 refills | Status: DC | PRN

## 2017-01-19 NOTE — Telephone Encounter (Signed)
Medication called to pharmacy. 

## 2017-01-19 NOTE — Addendum Note (Signed)
Addended by: Phillips Odor on: 01/19/2017 04:44 PM   Modules accepted: Orders

## 2017-01-19 NOTE — Telephone Encounter (Signed)
ok 

## 2017-01-19 NOTE — Telephone Encounter (Signed)
Request refill on Xanax 0.5 mg Last ov 09/10/2016 Next ov none Last refilled 10/05/2016 Please send Rx to [facility]

## 2017-01-29 ENCOUNTER — Ambulatory Visit: Payer: BLUE CROSS/BLUE SHIELD | Admitting: Cardiovascular Disease

## 2017-05-11 ENCOUNTER — Other Ambulatory Visit: Payer: Self-pay | Admitting: Family Medicine

## 2017-05-11 NOTE — Telephone Encounter (Signed)
Ok to refill??  Last office visit 09/10/2016.  Last refill 01/19/2017, #2 refills.

## 2017-10-06 ENCOUNTER — Other Ambulatory Visit: Payer: Self-pay | Admitting: Family Medicine

## 2017-10-06 NOTE — Telephone Encounter (Signed)
Ok to refill??  Last office visit 09/10/2016  Last refill 05/11/2017, #2 refills.

## 2017-12-01 ENCOUNTER — Other Ambulatory Visit: Payer: Self-pay | Admitting: Obstetrics and Gynecology

## 2017-12-01 DIAGNOSIS — R2231 Localized swelling, mass and lump, right upper limb: Secondary | ICD-10-CM

## 2017-12-02 ENCOUNTER — Other Ambulatory Visit: Payer: Self-pay | Admitting: Family Medicine

## 2017-12-02 NOTE — Telephone Encounter (Signed)
Pt is requesting refill on Xanax   LOV: 09/10/16  LRF:  10/06/2017

## 2017-12-03 ENCOUNTER — Ambulatory Visit: Payer: BLUE CROSS/BLUE SHIELD

## 2017-12-03 ENCOUNTER — Ambulatory Visit
Admission: RE | Admit: 2017-12-03 | Discharge: 2017-12-03 | Disposition: A | Discharge status: Home / Self Care | Payer: No Typology Code available for payment source | Source: Ambulatory Visit | Attending: Obstetrics and Gynecology | Admitting: Obstetrics and Gynecology

## 2017-12-03 DIAGNOSIS — R2231 Localized swelling, mass and lump, right upper limb: Secondary | ICD-10-CM

## 2017-12-07 ENCOUNTER — Encounter: Payer: Self-pay | Admitting: Family Medicine

## 2017-12-07 ENCOUNTER — Ambulatory Visit (INDEPENDENT_AMBULATORY_CARE_PROVIDER_SITE_OTHER): Payer: PRIVATE HEALTH INSURANCE | Admitting: Family Medicine

## 2017-12-07 ENCOUNTER — Other Ambulatory Visit: Payer: Self-pay | Admitting: Obstetrics and Gynecology

## 2017-12-07 VITALS — BP 100/62 | HR 84 | Temp 98.1°F | Resp 14 | Ht 66.0 in | Wt 118.0 lb

## 2017-12-07 DIAGNOSIS — L729 Follicular cyst of the skin and subcutaneous tissue, unspecified: Secondary | ICD-10-CM | POA: Diagnosis not present

## 2017-12-07 DIAGNOSIS — L237 Allergic contact dermatitis due to plants, except food: Secondary | ICD-10-CM

## 2017-12-07 DIAGNOSIS — F411 Generalized anxiety disorder: Secondary | ICD-10-CM | POA: Diagnosis not present

## 2017-12-07 DIAGNOSIS — Z13220 Encounter for screening for lipoid disorders: Secondary | ICD-10-CM | POA: Diagnosis not present

## 2017-12-07 DIAGNOSIS — R2230 Localized swelling, mass and lump, unspecified upper limb: Secondary | ICD-10-CM

## 2017-12-07 MED ORDER — MOMETASONE FUROATE 0.1 % EX CREA: 1.0000 "application " | TOPICAL_CREAM | Freq: Every day | CUTANEOUS | 0 refills | Status: DC

## 2017-12-07 NOTE — Progress Notes (Signed)
Subjective:    Patient ID: [name], female    DOB: 23-Nov-1970, 47 y.o.   MRN: [id number]  HPI  08/31/16 Over the last couple weeks, the patient has been reporting palpitations. They're brief and self terminating. She reports feeling like skipped heartbeats. She's not been sleeping well and is under increasing anxiety. She denies any chest pain or shortness of breath. EKG today reveals inverted P waves as well as an occasional PVC. However there is no evidence of ischemia or infarction.  At that time, my plan was: EKG demonstrates a PVC. Her history is consistent with PVCs. I will check a CBC, CMP, TSH. Start the patient on Toprol-XL 25 mg by mouth daily and recheck in one week. Discussed with the patient minimizing stress in her life and trying to improve sleep and avoiding stimulants. If symptoms persist, obtain echocardiogram to evaluate for valvular dysfunction or atrial enlargement which may possibly explain the inverted P waves  09/10/16 Ultimately, the patient called [place] back and wanted [place] to schedule the echocardiogram which we did. Echocardiogram revealed no valvular abnormalities or structural abnormalities in the heart. Holter monitor was placed and she returned that yesterday. She was having so many palpitations that were she went to the emergency room yesterday and was diagnosed with PVCs. They repeated the lab work that we have previously drawn and again it was normal. Of note the patient never started the Toprol-XL 25 mg a day. She has been using Xanax to sleep at night and she is sleeping well without difficulty but essentially she wakes up, the symptoms returned. She seems extremely anxious today. She is scheduled to see the cardiologist Tuesday of next week by ER physician.  She reports 50 PVCs in the 30 minutes it took her to drive to my clinic this afternoon.  At that time, my plan was: Await the results of Holter monitor however I believe this is an anxiety driven  phenomenon I believe that she's become so hypervigilant focusing on the PVCs is actually making it worse. I explained this at length with the patient. I begged her to try the metoprolol. I believe I can decrease the frequency of the PVCs with metoprolol, this will provide her some reassurance which then would hopefully help address her anxiety and the cycle would hopefully "snowball" in a positive fashion.  Furthermore if she takes metoprolol, this would provide her information she can share with the cardiologist on Tuesday regarding its effectiveness in case further interventions are considered. Also discussed the patient possibly changing citalopram to try to better manage her anxiety but she seemed resistant to this at the present time  12/07/17 Patient presents today with 3 separate concerns.  #1 she has noticed a small nodule in her right axilla.  She saw her gynecologist who performed a mammogram which was negative for any calcifications in the breast.  On examination today the nodule is extremely superficial and located in the skin.  It is 3 to 4 mm in diameter.  It is freely mobile and soft to texture.  I believe is either a small sebaceous cyst or possibly a blocked sebaceous gland.  There is no erythema tenderness or pain.  Second issue is poison oak.  She has had it now for more than a week.  It is located primarily behind her left knee and her right knee.  There are also scattered erythematous papules on her right and left forearm, near her groin.  They itch.  She is requesting cream to treat this.  Third issue is she would like fasting lab work for a physical exam.  She has not had blood work since May of last year.  I also talked to her about Xanax.  Currently she is using one half of a 0.5 mg Xanax tablet every night to help her sleep.  Otherwise she is doing well.  She does not require the Xanax during the day for anxiety.  She believes her anxiety and depression are well controlled on her current  medication.  We discussed the potential habit-forming nature of Xanax and I have recommended trying over-the-counter Unisom instead to avoid habituation and dependency and also use Xanax more infrequently only for breakthrough periods of insomnia.  She is perfectly willing to try this.  Past Medical History:  Diagnosis Date  . Anxiety   . Anxiety and depression   . C. difficile diarrhea   . Depression   . Esophageal reflux   . PONV (postoperative nausea and vomiting)    Past Surgical History:  Procedure Laterality Date  . AUGMENTATION MAMMAPLASTY Bilateral 2006   Saline, In front of the muscle   . CESAREAN SECTION    . COLONOSCOPY    . COLONOSCOPY  05/16/2012   Procedure: COLONOSCOPY;  Surgeon: Iva Booparl E Gessner, MD;  [facility];  Service: Endoscopy;  Laterality: N/A;  fecal transplant  . LUMBAR FUSION  1994, 1997   x 2, T11-L3  . TONSILLECTOMY    . UPPER GASTROINTESTINAL ENDOSCOPY     Current Outpatient Medications on File Prior to Visit  Medication Sig Dispense Refill  . ALPRAZolam (XANAX) 0.5 MG tablet TAKE 1 TABLET BY MOUTH EVERY 8 HOURS AS NEEDED FOR ANXIETY (Patient taking differently: TAKE 1/2 TABLET BY MOUTH EVERY 8 HOURS AS NEEDED FOR ANXIETY) 30 tablet 0  . citalopram (CELEXA) 20 MG tablet TK 1 T PO D  5   No current facility-administered medications on file prior to visit.    Allergies  Allergen Reactions  . Codeine Nausea And Vomiting  . Hydrocodone-Acetaminophen     REACTION: Palpitations  . Morphine     REACTION: Nausea  . Nitrofurantoin     REACTION: flu-like symtoms   Social History   Socioeconomic History  . Marital status: Married    Spouse name: Not on file  . Number of children: 1  . Years of education: Not on file  . Highest education level: Not on file  Occupational History  . Occupation: Event organisermanager    Employer: [employer]  Social Needs  . Financial resource strain: Not on file  . Food insecurity:    Worry: Not on file     Inability: Not on file  . Transportation needs:    Medical: Not on file    Non-medical: Not on file  Tobacco Use  . Smoking status: Never Smoker  . Smokeless tobacco: Never Used  Substance and Sexual Activity  . Alcohol use: No  . Drug use: No  . Sexual activity: Not on file  Lifestyle  . Physical activity:    Days per week: Not on file    Minutes per session: Not on file  . Stress: Not on file  Relationships  . Social connections:    Talks on phone: Not on file    Gets together: Not on file    Attends religious service: Not on file    Active member of club or organization: Not on file    Attends  meetings of clubs or organizations: Not on file    Relationship status: Not on file  . Intimate partner violence:    Fear of current or ex partner: Not on file    Emotionally abused: Not on file    Physically abused: Not on file    Forced sexual activity: Not on file  Other Topics Concern  . Not on file  Social History Narrative  . Not on file    Review of Systems  All other systems reviewed and are negative.      Objective:   Physical Exam  Constitutional: She appears well-developed and well-nourished.  Cardiovascular: Normal rate, regular rhythm and normal heart sounds.  No murmur heard. Pulmonary/Chest: Effort normal. No respiratory distress. She has no wheezes. She has no rales. She exhibits no tenderness.    Skin: Rash noted. Rash is papular.     Psychiatric: She has a normal mood and affect. Her behavior is normal. Judgment and thought content normal.  Vitals reviewed.         Assessment & Plan:  GAD (generalized anxiety disorder)  Benign cyst of skin  Poison oak Reassured the patient that I do not believe that lesion in her right axilla represents an abscess or malignancy.  I believe this is most likely a small 4 mm benign skin cyst.  I cannot rule out an occluded sebaceous gland or ingrown hair.  It is very small and difficult to determine today.  I  recommended clinical monitoring.  If the lesion persists, we could certainly excise the lesion in the office but at the present time I believe I would cause her more problems and removing the lesion and then if we just clinically monitor it given its small size.  She is comfortable with that plan.  We will treat the poison oak dermatitis with Elocon cream applied twice daily for 1 week for itching.  She will continue to use Xanax sparingly to help her sleep at night but she will try over-the-counter Unisom instead.  We will continue the Celexa and Xanax as needed for depression and anxiety.  She can return fasting at her convenience for a CBC, CMP, and fasting lipid panel

## 2017-12-08 ENCOUNTER — Other Ambulatory Visit: Payer: PRIVATE HEALTH INSURANCE

## 2017-12-08 LAB — CBC WITH DIFFERENTIAL/PLATELET
Basophils Absolute: 81 cells/uL (ref 0–200)
Basophils Relative: 1.8 %
EOS PCT: 3.1 %
Eosinophils Absolute: 140 cells/uL (ref 15–500)
HCT: 37.8 % (ref 35.0–45.0)
Hemoglobin: 12.6 g/dL (ref 11.7–15.5)
Lymphs Abs: 1827 cells/uL (ref 850–3900)
MCH: 30.3 pg (ref 27.0–33.0)
MCHC: 33.3 g/dL (ref 32.0–36.0)
MCV: 90.9 fL (ref 80.0–100.0)
MONOS PCT: 9.6 %
MPV: 11.2 fL (ref 7.5–12.5)
NEUTROS PCT: 44.9 %
Neutro Abs: 2021 cells/uL (ref 1500–7800)
Platelets: 218 10*3/uL (ref 140–400)
RBC: 4.16 10*6/uL (ref 3.80–5.10)
RDW: 11.6 % (ref 11.0–15.0)
TOTAL LYMPHOCYTE: 40.6 %
WBC mixed population: 432 cells/uL (ref 200–950)
WBC: 4.5 10*3/uL (ref 3.8–10.8)

## 2017-12-08 LAB — LIPID PANEL
Cholesterol: 189 mg/dL (ref <200)
HDL: 62 mg/dL (ref >50)
LDL Cholesterol (Calc): 109 mg/dL (calc) — ABNORMAL HIGH
NON-HDL CHOLESTEROL (CALC): 127 mg/dL (ref <130)
Total CHOL/HDL Ratio: 3 (calc) (ref <5.0)
Triglycerides: 86 mg/dL (ref <150)

## 2017-12-08 LAB — COMPLETE METABOLIC PANEL WITH GFR
AG RATIO: 1.9 (calc) (ref 1.0–2.5)
ALT: 14 U/L (ref 6–29)
AST: 20 U/L (ref 10–35)
Albumin: 4.2 g/dL (ref 3.6–5.1)
Alkaline phosphatase (APISO): 38 U/L (ref 33–115)
BUN: 15 mg/dL (ref 7–25)
CALCIUM: 9.3 mg/dL (ref 8.6–10.2)
CO2: 27 mmol/L (ref 20–32)
CREATININE: 0.81 mg/dL (ref 0.50–1.10)
Chloride: 105 mmol/L (ref 98–110)
GFR, EST NON AFRICAN AMERICAN: 86 mL/min/{1.73_m2} (ref >=60)
GFR, Est African American: 100 mL/min/{1.73_m2} (ref >=60)
GLUCOSE: 87 mg/dL (ref 65–99)
Globulin: 2.2 g/dL (calc) (ref 1.9–3.7)
POTASSIUM: 4.5 mmol/L (ref 3.5–5.3)
Sodium: 140 mmol/L (ref 135–146)
Total Bilirubin: 0.5 mg/dL (ref 0.2–1.2)
Total Protein: 6.4 g/dL (ref 6.1–8.1)

## 2017-12-13 ENCOUNTER — Inpatient Hospital Stay: Admission: RE | Admit: 2017-12-13 | Payer: No Typology Code available for payment source | Source: Ambulatory Visit

## 2017-12-20 ENCOUNTER — Encounter: Payer: Self-pay | Admitting: Family Medicine

## 2017-12-20 ENCOUNTER — Telehealth: Payer: Self-pay | Admitting: Family Medicine

## 2017-12-20 NOTE — Telephone Encounter (Signed)
Patient left a very non specific message on vm asking if you would call her  [phone]

## 2017-12-21 NOTE — Telephone Encounter (Signed)
See my chart message

## 2018-05-20 DIAGNOSIS — R61 Generalized hyperhidrosis: Secondary | ICD-10-CM | POA: Diagnosis not present

## 2018-09-05 ENCOUNTER — Other Ambulatory Visit: Payer: Self-pay | Admitting: Family Medicine

## 2018-09-12 DIAGNOSIS — C44519 Basal cell carcinoma of skin of other part of trunk: Secondary | ICD-10-CM | POA: Diagnosis not present

## 2018-10-10 ENCOUNTER — Other Ambulatory Visit: Payer: Self-pay

## 2018-10-10 ENCOUNTER — Telehealth: Payer: Self-pay | Admitting: *Deleted

## 2018-10-10 ENCOUNTER — Ambulatory Visit (INDEPENDENT_AMBULATORY_CARE_PROVIDER_SITE_OTHER): Payer: BC Managed Care – PPO | Admitting: Family Medicine

## 2018-10-10 DIAGNOSIS — U071 COVID-19: Secondary | ICD-10-CM | POA: Diagnosis not present

## 2018-10-10 MED ORDER — ALPRAZOLAM 0.5 MG PO TABS: 0.5000 mg | ORAL_TABLET | Freq: Three times a day (TID) | ORAL | 0 refills | Status: DC | PRN

## 2018-10-10 NOTE — Progress Notes (Signed)
Subjective:    Patient ID: [name], female    DOB: [birth year], 48 y.o.   MRN: [id number]  HPI Patient is being seen today as a phone visit.  Patient is currently at home working from home.  I am currently in my office.  Phone call began at 1135.  Phone call ended at 1147.  Unfortunately, the patient's husband became ill recently and was diagnosed with COVID-19.  He was hospitalized last week but fortunately is recovering.  As result, 5 days ago, the patient was tested for COVID-19.  She is completely asymptomatic even today.  However her COVID-19 test returned positive.  This was the NAA test indicating the presence of viral RNA in her nasal passages.  Patient is asymptomatic in questioning what she must do and how long she is contagious.  She denies any fever, chills, change in taste or smell, trouble breathing, cough, body aches.  She is completely asymptomatic and feels fine.  Fortunately her 976 year old daughter recently tested negative as well.  Patient is extremely anxious and is requesting Xanax because the entire situation has her experiencing panic attacks. Past Medical History:  Diagnosis Date  . Anxiety   . Anxiety and depression   . C. difficile diarrhea   . Depression   . Esophageal reflux   . PONV (postoperative nausea and vomiting)    Past Surgical History:  Procedure Laterality Date  . AUGMENTATION MAMMAPLASTY Bilateral 2006   Saline, In front of the muscle   . CESAREAN SECTION    . COLONOSCOPY    . COLONOSCOPY  05/16/2012   Procedure: COLONOSCOPY;  Surgeon: Iva Booparl E Gessner, MD;  Location: [facility] ENDOSCOPY;  Service: Endoscopy;  Laterality: N/A;  fecal transplant  . LUMBAR FUSION  1994, 1997   x 2, T11-L3  . TONSILLECTOMY    . UPPER GASTROINTESTINAL ENDOSCOPY     Current Outpatient Medications on File Prior to Visit  Medication Sig Dispense Refill  . ALPRAZolam (XANAX) 0.5 MG tablet TAKE 1 TABLET BY MOUTH EVERY 8 HOURS AS NEEDED FOR ANXIETY (Patient taking  differently: TAKE 1/2 TABLET BY MOUTH EVERY 8 HOURS AS NEEDED FOR ANXIETY) 30 tablet 0  . citalopram (CELEXA) 20 MG tablet TAKE 1 TABLET BY MOUTH DAILY 90 tablet 3  . mometasone (ELOCON) 0.1 % cream Apply 1 application topically daily. 45 g 0   No current facility-administered medications on file prior to visit.    Allergies  Allergen Reactions  . Codeine Nausea And Vomiting  . Hydrocodone-Acetaminophen     REACTION: Palpitations  . Morphine     REACTION: Nausea  . Nitrofurantoin     REACTION: flu-like symtoms   Social History   Socioeconomic History  . Marital status: Married    Spouse name: Not on file  . Number of children: 1  . Years of education: Not on file  . Highest education level: Not on file  Occupational History  . Occupation: Event organisermanager    Employer: [employer]  Social Needs  . Financial resource strain: Not on file  . Food insecurity:    Worry: Not on file    Inability: Not on file  . Transportation needs:    Medical: Not on file    Non-medical: Not on file  Tobacco Use  . Smoking status: Never Smoker  . Smokeless tobacco: Never Used  Substance and Sexual Activity  . Alcohol use: No  . Drug use: No  . Sexual activity: Not on file  Lifestyle  .  Physical activity:    Days per week: Not on file    Minutes per session: Not on file  . Stress: Not on file  Relationships  . Social connections:    Talks on phone: Not on file    Gets together: Not on file    Attends religious service: Not on file    Active member of club or organization: Not on file    Attends meetings of clubs or organizations: Not on file    Relationship status: Not on file  . Intimate partner violence:    Fear of current or ex partner: Not on file    Emotionally abused: Not on file    Physically abused: Not on file    Forced sexual activity: Not on file  Other Topics Concern  . Not on file  Social History Narrative  . Not on file      Review of Systems  All other  systems reviewed and are negative.      Objective:   Physical Exam  Physical exam cannot be performed as the patient was seen over the telephone however she is afebrile.  She is speaking full and complete sentences with no respiratory distress.  She demonstrates no coughing.  Her mentation is normal although anxious.      Assessment & Plan:  [id number] virus detected  Patient is asymptomatic however I explained to the patient that the viral RNA was detected in her nasal passages 5 days ago meaning that she is contagious.  I would assume that she is most likely contagious for an additional 7 to 10 days to complete 2 weeks total since testing.  Therefore I recommended that she quarantine an additional 7 to 10 days and then follow proper universal precautions including wearing a facemask when she returns to normal activity.  I have recommended that she quarantine herself separate from her daughter who tested negative.  I recommended that she not work around other people.  Patient states that she works at home and she is the only 1 in the business so this should not be an issue.  If she develops any symptoms she will contact me immediately however at the present time no treatment is necessary as she is asymptomatic.  I will give the patient some Xanax 0.5 mg tablets that she can use every 8 hours as needed for panic attacks.  I am glad the patient's husband is recovering

## 2018-10-10 NOTE — Telephone Encounter (Signed)
Per PCP, change appointment to with him.   Appointment re-scheduled.

## 2018-10-10 NOTE — Telephone Encounter (Signed)
Call placed to patient to discuss COVID results. Noted results positive.   Per PCP, self quarantine for additional week. Stay away from other members in household. Notify job as she was working last week per triage report.   Per patient, states that she has been self quarantining x12 days. Advised that testing is positive and thus she can transmit the virus. 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 Advised to notify any customers she had contact with.   States that she has multiple questions and wants to discuss with PCP. Advised that PCP schedule is full until 10/13/2018. Requested to discuss with any MD.   Appointment scheduled for telephone call with Dr. Buelah Manis.

## 2018-10-13 DIAGNOSIS — Z20828 Contact with and (suspected) exposure to other viral communicable diseases: Secondary | ICD-10-CM | POA: Diagnosis not present

## 2018-10-14 ENCOUNTER — Telehealth: Payer: Self-pay | Admitting: *Deleted

## 2018-10-14 ENCOUNTER — Telehealth: Payer: Self-pay

## 2018-10-14 ENCOUNTER — Other Ambulatory Visit: Payer: No Typology Code available for payment source

## 2018-10-14 DIAGNOSIS — Z20822 Contact with and (suspected) exposure to covid-19: Secondary | ICD-10-CM

## 2018-10-14 NOTE — Telephone Encounter (Signed)
Per MD, patient can be set up for repeat testing patient.   CXR is not indicated at this time as patient is having no Sx and is known + for COVID.   Call placed to patient. Made aware that Rapid COVID testing is not offered outpatient. States that she and her husband went to [facility] to have lab obtained today.   Reports that she feels she requires CXR as she has slight cough.   Per MD, due to COVID + status, CXR can only be obtained through ER.   Patient made aware and states that she will go to ER for testing. Patient notably upset and hung up on writer before recommendation to go to [facility] could be given.

## 2018-10-14 NOTE — Telephone Encounter (Signed)
Received call from patient. [phone]~ home/ [phone]~ cell.   Reports that patient spouse is now home and was advised by hospital to have repeat testing for COVID to ensure that he is testing negative. Was advised that there are 2 hour testing sites that we can order to, but was unsure where they are located. Patient wife states that she would like this testing to determine if their daughter can return home.   Also states that she would like CXR to ensure that lungs are clear. Ok to order?  Call placed to [facility] to inquire. [phone]~ telephone. Reports that 2 hour testing is available in house at [facility] for previously positive patients, but is only available if patient is admitted. Since patient has been discharged, he is not eligible for 2 hour testing. Patient would need to go to outpatient testing site.  MD please advise.

## 2018-10-14 NOTE — Telephone Encounter (Signed)
Dr. Jenna Luo with [facility] requesting COVID 19 test for repeat. Previous positive test. Phone [phone]. Fax [phone].

## 2018-10-14 NOTE — Telephone Encounter (Signed)
Dr. Jenna Luo with [facility] requesting COVID 19 test for repeat. Previous positive test. Phone [phone]. Fax [phone].  Pt scheduled for covid testing for Monday at [place] at [facility] site.  Testing process reviewed, verbalizes understanding.    Pt states [facility] days ago, not today.

## 2018-10-14 NOTE — Telephone Encounter (Signed)
Call placed to patient to discuss provider recommendations for re-testing.  States that she and her husband went [facility] to [facility] lab obtained today.

## 2018-10-17 ENCOUNTER — Other Ambulatory Visit: Payer: No Typology Code available for payment source

## 2018-10-17 DIAGNOSIS — Z20822 Contact with and (suspected) exposure to covid-19: Secondary | ICD-10-CM

## 2018-10-17 DIAGNOSIS — R6889 Other general symptoms and signs: Secondary | ICD-10-CM | POA: Diagnosis not present

## 2018-10-19 LAB — NOVEL CORONAVIRUS, NAA: SARS-CoV-2, NAA: NOT DETECTED

## 2018-11-01 ENCOUNTER — Other Ambulatory Visit: Payer: Self-pay | Admitting: Obstetrics and Gynecology

## 2018-11-01 DIAGNOSIS — Z1231 Encounter for screening mammogram for malignant neoplasm of breast: Secondary | ICD-10-CM

## 2018-12-23 ENCOUNTER — Other Ambulatory Visit: Payer: Self-pay

## 2018-12-23 ENCOUNTER — Ambulatory Visit
Admission: RE | Admit: 2018-12-23 | Discharge: 2018-12-23 | Disposition: A | Discharge status: Home / Self Care | Payer: BC Managed Care – PPO | Source: Ambulatory Visit | Attending: Obstetrics and Gynecology | Admitting: Obstetrics and Gynecology

## 2018-12-23 DIAGNOSIS — Z1231 Encounter for screening mammogram for malignant neoplasm of breast: Secondary | ICD-10-CM

## 2019-01-04 ENCOUNTER — Other Ambulatory Visit: Payer: Self-pay | Admitting: Family Medicine

## 2019-01-04 NOTE — Telephone Encounter (Signed)
Pt is requesting refill on Xanax   LOV: 10/10/18 LRF:   12/02/17

## 2019-01-05 MED ORDER — ALPRAZOLAM 0.5 MG PO TABS: 0.5000 mg | ORAL_TABLET | Freq: Three times a day (TID) | ORAL | 2 refills | Status: DC | PRN

## 2019-01-23 DIAGNOSIS — D225 Melanocytic nevi of trunk: Secondary | ICD-10-CM | POA: Diagnosis not present

## 2019-01-23 DIAGNOSIS — Z85828 Personal history of other malignant neoplasm of skin: Secondary | ICD-10-CM | POA: Diagnosis not present

## 2019-01-23 DIAGNOSIS — L821 Other seborrheic keratosis: Secondary | ICD-10-CM | POA: Diagnosis not present

## 2019-02-17 ENCOUNTER — Telehealth: Payer: Self-pay | Admitting: Family Medicine

## 2019-02-17 MED ORDER — OLOPATADINE HCL 0.1 % OP SOLN: OPHTHALMIC | 11 refills | Status: DC

## 2019-02-17 NOTE — Telephone Encounter (Signed)
Patient called in with c/o right eye itching, and eye watering. Patient would like to know what you recommend. Patient states she does not take any allergies medicines and has had pink eye in the past but denies swelling and crusting of right eye. Please advise?

## 2019-02-17 NOTE — Telephone Encounter (Signed)
Left message return call

## 2019-02-17 NOTE — Telephone Encounter (Signed)
Try pataday OTC 1-2 drop affected eye bid.

## 2019-03-06 DIAGNOSIS — Z124 Encounter for screening for malignant neoplasm of cervix: Secondary | ICD-10-CM | POA: Diagnosis not present

## 2019-03-06 DIAGNOSIS — Z01419 Encounter for gynecological examination (general) (routine) without abnormal findings: Secondary | ICD-10-CM | POA: Diagnosis not present

## 2019-03-06 DIAGNOSIS — Z681 Body mass index (BMI) 19 or less, adult: Secondary | ICD-10-CM | POA: Diagnosis not present

## 2019-03-06 DIAGNOSIS — Z1151 Encounter for screening for human papillomavirus (HPV): Secondary | ICD-10-CM | POA: Diagnosis not present

## 2019-05-11 ENCOUNTER — Encounter: Payer: Self-pay | Admitting: Family Medicine

## 2019-05-11 ENCOUNTER — Other Ambulatory Visit: Payer: Self-pay

## 2019-05-11 ENCOUNTER — Ambulatory Visit: Payer: BC Managed Care – PPO | Admitting: Family Medicine

## 2019-05-11 VITALS — BP 120/74 | HR 76 | Temp 97.4°F | Resp 14 | Ht 66.0 in | Wt 118.0 lb

## 2019-05-11 DIAGNOSIS — M79641 Pain in right hand: Secondary | ICD-10-CM

## 2019-05-11 NOTE — Progress Notes (Signed)
Subjective:    Patient ID: [name], female    DOB: [birth year], 49 y.o.   MRN: [id number]  Patient reports bilateral hand pain and stiffness.  The stiffness and pain is primarily in the PIP and DIP joints on both hands however right is worse than left.  She has a palpable arthritic nodule on the DIP joint of her right fifth digit on the ulnar side.  She also has similar spurring forming on her index finger as well as the ring finger.  I believe these are osteoarthritic changes.  There is no erythema or warmth or effusion.  There is no ulnar deviation.  She does report pain and stiffness in the morning.  However this is minimal.  She denies any other joint pain anywhere else on the body.  She does complain of some pain in her right elbow over the lateral epicondyle.  This occurred after she struck this against a door by accident.  It is still very tender to touch although there is no bruising.  There is no swelling.  There is no palpable deformity. Past Medical History:  Diagnosis Date  . Anxiety   . Anxiety and depression   . C. difficile diarrhea   . Depression   . Esophageal reflux   . PONV (postoperative nausea and vomiting)    Past Surgical History:  Procedure Laterality Date  . AUGMENTATION MAMMAPLASTY Bilateral 2006   Saline, In front of the muscle   . CESAREAN SECTION    . COLONOSCOPY    . COLONOSCOPY  05/16/2012   Procedure: COLONOSCOPY;  Surgeon: Iva Boop, MD;  Location: [facility] ENDOSCOPY;  Service: Endoscopy;  Laterality: N/A;  fecal transplant  . LUMBAR FUSION  1994, 1997   x 2, T11-L3  . TONSILLECTOMY    . UPPER GASTROINTESTINAL ENDOSCOPY     Current Outpatient Medications on File Prior to Visit  Medication Sig Dispense Refill  . ALPRAZolam (XANAX) 0.5 MG tablet Take 1 tablet (0.5 mg total) by mouth 3 (three) times daily as needed for anxiety. 30 tablet 0  . ALPRAZolam (XANAX) 0.5 MG tablet Take 1 tablet (0.5 mg total) by mouth every 8 (eight) hours as needed.  for anxiety 30 tablet 2  . citalopram (CELEXA) 20 MG tablet TAKE 1 TABLET BY MOUTH DAILY 90 tablet 3  . mometasone (ELOCON) 0.1 % cream Apply 1 application topically daily. 45 g 0  . olopatadine (PATADAY) 0.1 % ophthalmic solution Place 1-2 drops into affected eye twice daily 5 mL 11   No current facility-administered medications on file prior to visit.   Allergies  Allergen Reactions  . Codeine Nausea And Vomiting  . Hydrocodone-Acetaminophen     REACTION: Palpitations  . Morphine     REACTION: Nausea  . Nitrofurantoin     REACTION: flu-like symtoms   Social History   Socioeconomic History  . Marital status: Married    Spouse name: Not on file  . Number of children: 1  . Years of education: Not on file  . Highest education level: Not on file  Occupational History  . Occupation: Event organiser: [employer]  Tobacco Use  . Smoking status: Never Smoker  . Smokeless tobacco: Never Used  Substance and Sexual Activity  . Alcohol use: No  . Drug use: No  . Sexual activity: Not on file  Other Topics Concern  . Not on file  Social History Narrative  . Not on file  Social Determinants of Health   Financial Resource Strain:   . Difficulty of Paying Living Expenses: Not on file  Food Insecurity:   . Worried About Charity fundraiser in the Last Year: Not on file  . Ran Out of Food in the Last Year: Not on file  Transportation Needs:   . Lack of Transportation (Medical): Not on file  . Lack of Transportation (Non-Medical): Not on file  Physical Activity:   . Days of Exercise per Week: Not on file  . Minutes of Exercise per Session: Not on file  Stress:   . Feeling of Stress : Not on file  Social Connections:   . Frequency of Communication with Friends and Family: Not on file  . Frequency of Social Gatherings with Friends and Family: Not on file  . Attends Religious Services: Not on file  . Active Member of Clubs or Organizations: Not on file  . Attends  Archivist Meetings: Not on file  . Marital Status: Not on file  Intimate Partner Violence:   . Fear of Current or Ex-Partner: Not on file  . Emotionally Abused: Not on file  . Physically Abused: Not on file  . Sexually Abused: Not on file    Review of Systems  All other systems reviewed and are negative.      Objective:   Physical Exam  Constitutional: She appears well-developed and well-nourished.  Cardiovascular: Normal rate, regular rhythm and normal heart sounds.  No murmur heard. Pulmonary/Chest: Effort normal. No respiratory distress. She has no wheezes. She has no rales. She exhibits no tenderness.  Musculoskeletal:     Right hand: Deformity and tenderness present. No swelling. Normal sensation.  Psychiatric: She has a normal mood and affect. Her behavior is normal. Judgment and thought content normal.  Vitals reviewed.         Assessment & Plan:  Hand pain, right - Plan: Rheumatoid factor  I believe the patient has physiologic changes in her hands due to osteoarthritis.  I recommended using Voltaren gel 2 g 4 times daily as needed.  I believe she also suffered a contusion to her right lateral epicondyle and she can use the Voltaren there.  There is no evidence of lateral epicondylitis today on her exam.  Proceed with an x-ray of the hand if pain worsens.  Meanwhile screen for rheumatoid arthritis by checking a rheumatoid factor however I suspect osteoarthritis.

## 2019-05-12 LAB — RHEUMATOID FACTOR: Rhuematoid fact SerPl-aCnc: <14 IU/mL (ref <14)

## 2019-05-26 ENCOUNTER — Telehealth: Payer: Self-pay | Admitting: Family Medicine

## 2019-05-26 ENCOUNTER — Other Ambulatory Visit: Payer: Self-pay | Admitting: Family Medicine

## 2019-05-26 DIAGNOSIS — M25521 Pain in right elbow: Secondary | ICD-10-CM

## 2019-05-26 NOTE — Telephone Encounter (Signed)
I will order xray

## 2019-05-26 NOTE — Telephone Encounter (Signed)
Pt aware via vm 

## 2019-05-26 NOTE — Telephone Encounter (Signed)
Pt called back and states that her elbow is no better and you had mentioned a possible xray?

## 2019-06-02 ENCOUNTER — Ambulatory Visit
Admission: RE | Admit: 2019-06-02 | Discharge: 2019-06-02 | Disposition: A | Discharge status: Home / Self Care | Payer: BC Managed Care – PPO | Source: Ambulatory Visit | Attending: Family Medicine | Admitting: Family Medicine

## 2019-06-02 DIAGNOSIS — M25521 Pain in right elbow: Secondary | ICD-10-CM

## 2019-06-02 DIAGNOSIS — S59901A Unspecified injury of right elbow, initial encounter: Secondary | ICD-10-CM | POA: Diagnosis not present

## 2019-06-28 ENCOUNTER — Other Ambulatory Visit: Payer: Self-pay | Admitting: Family Medicine

## 2019-06-28 NOTE — Telephone Encounter (Signed)
Pt is requesting refill on Xanax   LOV: 05/25/19  LRF:   10/10/18

## 2019-06-29 MED ORDER — ALPRAZOLAM 0.5 MG PO TABS: 0.5000 mg | ORAL_TABLET | Freq: Three times a day (TID) | ORAL | 0 refills | Status: DC | PRN

## 2019-07-14 ENCOUNTER — Telehealth: Payer: Self-pay

## 2019-07-14 ENCOUNTER — Ambulatory Visit (INDEPENDENT_AMBULATORY_CARE_PROVIDER_SITE_OTHER): Payer: BC Managed Care – PPO | Admitting: Family Medicine

## 2019-07-14 ENCOUNTER — Other Ambulatory Visit: Payer: Self-pay

## 2019-07-14 DIAGNOSIS — T50Z95A Adverse effect of other vaccines and biological substances, initial encounter: Secondary | ICD-10-CM

## 2019-07-14 NOTE — Telephone Encounter (Signed)
Error

## 2019-07-14 NOTE — Progress Notes (Signed)
Subjective:    Patient ID: [name], female    DOB: 08-16-1970, 49 y.o.   MRN: [id number]  HPI  Patient is being seen today as a telephone visit.  Phone call began at 20.  1217.  Patient had Covid last fall.  She recently received her first Covid vaccination.  Shortly thereafter she developed body aches, fever, chills, and fatigue.  She states the symptoms lasted 2 or 3 days and she is still not back to normal.  She is very hesitant to receive the second vaccination.  She wanted to discuss this in detail. Past Medical History:  Diagnosis Date  . Anxiety   . Anxiety and depression   . C. difficile diarrhea   . Depression   . Esophageal reflux   . PONV (postoperative nausea and vomiting)    Past Surgical History:  Procedure Laterality Date  . AUGMENTATION MAMMAPLASTY Bilateral 2006   Saline, In front of the muscle   . CESAREAN SECTION    . COLONOSCOPY    . COLONOSCOPY  05/16/2012   Procedure: COLONOSCOPY;  Surgeon: Gatha Mayer, MD;  Location: [place];  Service: Endoscopy;  Laterality: N/A;  fecal transplant  . LUMBAR FUSION  1994, 1997   x 2, T11-L3  . TONSILLECTOMY    . UPPER GASTROINTESTINAL ENDOSCOPY     Current Outpatient Medications on File Prior to Visit  Medication Sig Dispense Refill  . ALPRAZolam (XANAX) 0.5 MG tablet Take 1 tablet (0.5 mg total) by mouth 3 (three) times daily as needed for anxiety. 30 tablet 0  . citalopram (CELEXA) 20 MG tablet TAKE 1 TABLET BY MOUTH DAILY 90 tablet 3   No current facility-administered medications on file prior to visit.   Allergies  Allergen Reactions  . Codeine Nausea And Vomiting  . Hydrocodone-Acetaminophen     REACTION: Palpitations  . Morphine     REACTION: Nausea  . Nitrofurantoin     REACTION: flu-like symtoms   Social History   Socioeconomic History  . Marital status: Married    Spouse name: Not on file  . Number of children: 1  . Years of education: Not on file  . Highest education level: Not on  file  Occupational History  . 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[employer]  Tobacco Use  . Smoking status: Never Smoker  . Smokeless tobacco: Never Used  Substance and Sexual Activity  . Alcohol use: No  . Drug use: No  . Sexual activity: Not on file  Other Topics Concern  . Not on file  Social History Narrative  . Not on file   Social Determinants of Health   Financial Resource Strain:   . Difficulty of Paying Living Expenses:   Food Insecurity:   . Worried About Charity fundraiser in the Last Year:   . Arboriculturist in the Last Year:   Transportation Needs:   . Film/video editor (Medical):   Marland Kitchen Lack of Transportation (Non-Medical):   Physical Activity:   . Days of Exercise per Week:   . Minutes of Exercise per Session:   Stress:   . Feeling of Stress :   Social Connections:   . Frequency of Communication with Friends and Family:   . Frequency of Social Gatherings with Friends and Family:   . Attends Religious Services:   . Active Member of Clubs or Organizations:   . Attends Archivist Meetings:   Marland Kitchen Marital Status:  Intimate Partner Violence:   . Fear of Current or Ex-Partner:   . Emotionally Abused:   Marland Kitchen Physically Abused:   . Sexually Abused:      Review of Systems  All other systems reviewed and are negative.      Objective:   Physical Exam        Assessment & Plan:  Immunization reaction, initial encounter  I explained to her that I do not feel what she was experiencing was an allergic reaction.  Instead she likely has antibodies remaining of her initial infection therefore when she received her first dose of the vaccine she had a vigorous immune response.  She would likely experience a similar reaction from the second dose of the vaccine.  I see no medical contraindications to her receiving the second dose of the vaccine however also explained that she likely has 60 to 70% immunity after having received the first dose.   In all honesty she likely has even higher given the fact she is also already have a virus.  Therefore it is her discretion whether she takes the second dose of the vaccine or not.  I would encourage her to receive it however.

## 2019-08-24 ENCOUNTER — Other Ambulatory Visit: Payer: Self-pay | Admitting: Family Medicine

## 2019-08-29 ENCOUNTER — Encounter: Payer: Self-pay | Admitting: Family Medicine

## 2019-08-29 DIAGNOSIS — L821 Other seborrheic keratosis: Secondary | ICD-10-CM | POA: Diagnosis not present

## 2019-08-29 DIAGNOSIS — Z85828 Personal history of other malignant neoplasm of skin: Secondary | ICD-10-CM | POA: Diagnosis not present

## 2019-08-29 DIAGNOSIS — C44519 Basal cell carcinoma of skin of other part of trunk: Secondary | ICD-10-CM | POA: Diagnosis not present

## 2019-11-09 ENCOUNTER — Other Ambulatory Visit: Payer: Self-pay | Admitting: Family Medicine

## 2019-11-09 NOTE — Telephone Encounter (Signed)
Ok to refill??  Last office visit 07/14/2019  Last refill 06/29/2019.

## 2019-11-20 ENCOUNTER — Other Ambulatory Visit: Payer: Self-pay | Admitting: Obstetrics and Gynecology

## 2019-11-20 DIAGNOSIS — Z1231 Encounter for screening mammogram for malignant neoplasm of breast: Secondary | ICD-10-CM

## 2019-12-26 ENCOUNTER — Ambulatory Visit
Admission: RE | Admit: 2019-12-26 | Discharge: 2019-12-26 | Disposition: A | Discharge status: Home / Self Care | Payer: BC Managed Care – PPO | Source: Ambulatory Visit | Attending: Obstetrics and Gynecology | Admitting: Obstetrics and Gynecology

## 2019-12-26 ENCOUNTER — Other Ambulatory Visit: Payer: Self-pay

## 2019-12-26 ENCOUNTER — Ambulatory Visit: Payer: BC Managed Care – PPO | Admitting: Family Medicine

## 2019-12-26 ENCOUNTER — Encounter: Payer: Self-pay | Admitting: Family Medicine

## 2019-12-26 VITALS — BP 102/70 | HR 67 | Temp 97.6°F | Ht 66.0 in | Wt 119.0 lb

## 2019-12-26 DIAGNOSIS — F5101 Primary insomnia: Secondary | ICD-10-CM | POA: Diagnosis not present

## 2019-12-26 DIAGNOSIS — Z1231 Encounter for screening mammogram for malignant neoplasm of breast: Secondary | ICD-10-CM

## 2019-12-26 MED ORDER — TRAZODONE HCL 50 MG PO TABS: 25.0000 mg | ORAL_TABLET | Freq: Every evening | ORAL | 3 refills | Status: DC | PRN

## 2019-12-26 NOTE — Progress Notes (Signed)
Subjective:    Patient ID: [name], female    DOB: 25-Jun-1970, 49 y.o.   MRN: [id number]  HPI  Patient presents today with 2 request.  First she is having a difficult time with insomnia.  She denies watching TV in bed.  She denies using electronic devices in bed.  She denies reading in bed.  She keeps the bedroom cool at 68 degrees.  She sleeps with a fan running.  She has tried exercising earlier in the day.  She avoids coffee and caffeine altogether.  However she is still having trouble sleeping at night.  She has tried ZzzQuil with little success.  She denies any depression or anxiety that is out of control.  There are no contributing factors other than the fact that she has started having perimenopausal symptoms including hot flashes.  The hot flashes or night waking her up or keeping her awake however she assumes that the hormone changes in her body may be causing the insomnia.  Otherwise she is doing well with no concerns.  The second request that she would like lab work particular to check her thyroid.  She does complain of some fatigue which she attributes to the menopause.  However she wants to make sure that there is not an underlying issue. Past Medical History:  Diagnosis Date  . Anxiety   . Anxiety and depression   . C. difficile diarrhea   . Depression   . Esophageal reflux   . PONV (postoperative nausea and vomiting)    Past Surgical History:  Procedure Laterality Date  . AUGMENTATION MAMMAPLASTY Bilateral 2006   Saline, In front of the muscle   . CESAREAN SECTION    . COLONOSCOPY    . COLONOSCOPY  05/16/2012   Procedure: COLONOSCOPY;  Surgeon: Iva Boop, MD;  Location: [facility] ENDOSCOPY;  Service: Endoscopy;  Laterality: N/A;  fecal transplant  . LUMBAR FUSION  1994, 1997   x 2, T11-L3  . TONSILLECTOMY    . UPPER GASTROINTESTINAL ENDOSCOPY     Current Outpatient Medications on File Prior to Visit  Medication Sig Dispense Refill  . ALPRAZolam (XANAX) 0.5 MG  tablet TAKE 1 TABLET(0.5 MG) BY MOUTH THREE TIMES DAILY AS NEEDED FOR ANXIETY 30 tablet 0  . citalopram (CELEXA) 20 MG tablet TAKE 1 TABLET BY MOUTH DAILY 90 tablet 3   No current facility-administered medications on file prior to visit.   Allergies  Allergen Reactions  . Codeine Nausea And Vomiting  . Hydrocodone-Acetaminophen     REACTION: Palpitations  . Morphine     REACTION: Nausea  . Nitrofurantoin     REACTION: flu-like symtoms   Social History   Socioeconomic History  . Marital status: Married    Spouse name: Not on file  . Number of children: 1  . Years of education: Not on file  . Highest education level: Not on file  Occupational History  . Occupation: Event organiser: [employer]  Tobacco Use  . Smoking status: Never Smoker  . Smokeless tobacco: Never Used  Substance and Sexual Activity  . Alcohol use: No  . Drug use: No  . Sexual activity: Not on file  Other Topics Concern  . Not on file  Social History Narrative  . Not on file   Social Determinants of Health   Financial Resource Strain:   . Difficulty of Paying Living Expenses: Not on file  Food Insecurity:   . Worried About [employer] of  Food in the Last Year: Not on file  . Ran Out of Food in the Last Year: Not on file  Transportation Needs:   . Lack of Transportation (Medical): Not on file  . Lack of Transportation (Non-Medical): Not on file  Physical Activity:   . Days of Exercise per Week: Not on file  . Minutes of Exercise per Session: Not on file  Stress:   . Feeling of Stress : Not on file  Social Connections:   . Frequency of Communication with Friends and Family: Not on file  . Frequency of Social Gatherings with Friends and Family: Not on file  . Attends Religious Services: Not on file  . Active Member of Clubs or Organizations: Not on file  . Attends Banker Meetings: Not on file  . Marital Status: Not on file  Intimate Partner Violence:   . Fear of  Current or Ex-Partner: Not on file  . Emotionally Abused: Not on file  . Physically Abused: Not on file  . Sexually Abused: Not on file     Review of Systems  All other systems reviewed and are negative.      Objective:   Physical Exam Vitals reviewed.  Constitutional:      Appearance: Normal appearance. She is normal weight.  Cardiovascular:     Rate and Rhythm: Normal rate and regular rhythm.  Pulmonary:     Effort: Pulmonary effort is normal.     Breath sounds: Normal breath sounds.  Neurological:     General: No focal deficit present.     Mental Status: She is alert and oriented to person, place, and time.  Psychiatric:        Mood and Affect: Mood normal.        Behavior: Behavior normal.        Thought Content: Thought content normal.        Judgment: Judgment normal.           Assessment & Plan:  Primary insomnia - Plan: CBC with Differential/Platelet, COMPLETE METABOLIC PANEL WITH GFR, TSH  We discussed options to treat the insomnia including habit-forming medication such as Xanax, Ambien, etc. and nonhabit-forming alternative such as trazodone.  The patient would like to try trazodone 25 to 50 mg p.o. nightly as needed for insomnia.  Cautioned the patient to watch for dry mouth, and constipation.

## 2019-12-27 LAB — CBC WITH DIFFERENTIAL/PLATELET
Absolute Monocytes: 482 cells/uL (ref 200–950)
Basophils Absolute: 69 cells/uL (ref 0–200)
Basophils Relative: 1.3 %
Eosinophils Absolute: 101 cells/uL (ref 15–500)
Eosinophils Relative: 1.9 %
HCT: 38.3 % (ref 35.0–45.0)
Hemoglobin: 12.7 g/dL (ref 11.7–15.5)
Lymphs Abs: 2019 cells/uL (ref 850–3900)
MCH: 30.2 pg (ref 27.0–33.0)
MCHC: 33.2 g/dL (ref 32.0–36.0)
MCV: 91 fL (ref 80.0–100.0)
MPV: 11.2 fL (ref 7.5–12.5)
Monocytes Relative: 9.1 %
Neutro Abs: 2629 cells/uL (ref 1500–7800)
Neutrophils Relative %: 49.6 %
Platelets: 237 10*3/uL (ref 140–400)
RBC: 4.21 10*6/uL (ref 3.80–5.10)
RDW: 12.5 % (ref 11.0–15.0)
Total Lymphocyte: 38.1 %
WBC: 5.3 10*3/uL (ref 3.8–10.8)

## 2019-12-27 LAB — COMPLETE METABOLIC PANEL WITH GFR
AG Ratio: 2 (calc) (ref 1.0–2.5)
ALT: 15 U/L (ref 6–29)
AST: 24 U/L (ref 10–35)
Albumin: 4.2 g/dL (ref 3.6–5.1)
Alkaline phosphatase (APISO): 54 U/L (ref 37–153)
BUN: 23 mg/dL (ref 7–25)
CO2: 25 mmol/L (ref 20–32)
Calcium: 9.2 mg/dL (ref 8.6–10.4)
Chloride: 103 mmol/L (ref 98–110)
Creat: 0.83 mg/dL (ref 0.50–1.05)
GFR, Est African American: 95 mL/min/{1.73_m2} (ref >=60)
GFR, Est Non African American: 82 mL/min/{1.73_m2} (ref >=60)
Globulin: 2.1 g/dL (calc) (ref 1.9–3.7)
Glucose, Bld: 91 mg/dL (ref 65–99)
Potassium: 4.5 mmol/L (ref 3.5–5.3)
Sodium: 138 mmol/L (ref 135–146)
Total Bilirubin: 0.4 mg/dL (ref 0.2–1.2)
Total Protein: 6.3 g/dL (ref 6.1–8.1)

## 2019-12-27 LAB — TSH: TSH: 1.24 mIU/L

## 2020-02-07 DIAGNOSIS — D229 Melanocytic nevi, unspecified: Secondary | ICD-10-CM | POA: Diagnosis not present

## 2020-02-07 DIAGNOSIS — D239 Other benign neoplasm of skin, unspecified: Secondary | ICD-10-CM | POA: Diagnosis not present

## 2020-02-07 DIAGNOSIS — Z85828 Personal history of other malignant neoplasm of skin: Secondary | ICD-10-CM | POA: Diagnosis not present

## 2020-02-07 DIAGNOSIS — L720 Epidermal cyst: Secondary | ICD-10-CM | POA: Diagnosis not present

## 2020-02-10 DIAGNOSIS — D229 Melanocytic nevi, unspecified: Secondary | ICD-10-CM | POA: Diagnosis not present

## 2020-02-10 DIAGNOSIS — L720 Epidermal cyst: Secondary | ICD-10-CM | POA: Diagnosis not present

## 2020-02-10 DIAGNOSIS — Z85828 Personal history of other malignant neoplasm of skin: Secondary | ICD-10-CM | POA: Diagnosis not present

## 2020-02-10 DIAGNOSIS — D239 Other benign neoplasm of skin, unspecified: Secondary | ICD-10-CM | POA: Diagnosis not present

## 2020-02-12 DIAGNOSIS — R3915 Urgency of urination: Secondary | ICD-10-CM | POA: Diagnosis not present

## 2020-03-14 DIAGNOSIS — Z681 Body mass index (BMI) 19 or less, adult: Secondary | ICD-10-CM | POA: Diagnosis not present

## 2020-03-14 DIAGNOSIS — Z01419 Encounter for gynecological examination (general) (routine) without abnormal findings: Secondary | ICD-10-CM | POA: Diagnosis not present

## 2020-04-04 ENCOUNTER — Other Ambulatory Visit: Payer: Self-pay

## 2020-04-04 ENCOUNTER — Telehealth: Payer: Self-pay | Admitting: Family Medicine

## 2020-04-04 MED ORDER — ALPRAZOLAM 0.5 MG PO TABS: ORAL_TABLET | ORAL | 0 refills | Status: DC

## 2020-04-04 NOTE — Telephone Encounter (Signed)
Rx refill sent Provider

## 2020-04-04 NOTE — Telephone Encounter (Signed)
Refill Xanax

## 2020-04-04 NOTE — Telephone Encounter (Signed)
Refill Xanax [id number] - [place], [place] - [address]

## 2020-05-13 DIAGNOSIS — Z20828 Contact with and (suspected) exposure to other viral communicable diseases: Secondary | ICD-10-CM | POA: Diagnosis not present

## 2020-06-10 ENCOUNTER — Other Ambulatory Visit: Payer: Self-pay | Admitting: Family Medicine

## 2020-06-10 NOTE — Telephone Encounter (Signed)
Ok to refill??  Last office visit 12/26/2019.  Last refill 04/04/2020.

## 2020-08-12 ENCOUNTER — Other Ambulatory Visit: Payer: Self-pay | Admitting: Family Medicine

## 2020-09-17 ENCOUNTER — Ambulatory Visit (INDEPENDENT_AMBULATORY_CARE_PROVIDER_SITE_OTHER): Payer: BC Managed Care – PPO | Admitting: Family Medicine

## 2020-09-17 ENCOUNTER — Encounter: Payer: Self-pay | Admitting: Family Medicine

## 2020-09-17 ENCOUNTER — Other Ambulatory Visit: Payer: Self-pay

## 2020-09-17 VITALS — BP 110/64 | HR 78 | Temp 98.7°F | Resp 14 | Ht 66.0 in | Wt 115.0 lb

## 2020-09-17 DIAGNOSIS — Z0000 Encounter for general adult medical examination without abnormal findings: Secondary | ICD-10-CM

## 2020-09-17 DIAGNOSIS — Z13220 Encounter for screening for lipoid disorders: Secondary | ICD-10-CM | POA: Diagnosis not present

## 2020-09-17 DIAGNOSIS — Z136 Encounter for screening for cardiovascular disorders: Secondary | ICD-10-CM | POA: Diagnosis not present

## 2020-09-17 MED ORDER — ALPRAZOLAM 0.5 MG PO TABS: 0.5000 mg | ORAL_TABLET | Freq: Every day | ORAL | 0 refills | Status: DC

## 2020-09-17 NOTE — Progress Notes (Signed)
Subjective:    Patient ID: [name], female    DOB: 07-Oct-1970, 50 y.o.   MRN: [id number]  HPI Patient is a very pleasant 50 year old Caucasian female here today for complete physical exam.  She states that she is using Xanax maybe 1 night out of a month to help her sleep only when she is under a lot of stress.  I have no concerns about this moving forward and I would be happy to refill it.  Her mammogram is due in August but she schedules this herself.  She had a colonoscopy in 2014 that was completely clear.  Therefore she is due for repeat colonoscopy in 2024.  Her Pap smear was performed by her gynecologist.  She states that she went through menopause about 2 years ago.  Therefore she would be due for bone density screening between age of 47 and 55.  She is not taking any calcium or vitamin D. Immunization History  Administered Date(s) Administered  . Influenza Split 02/16/2012  . Influenza Whole 03/16/2007, 01/24/2008  . Influenza,inj,Quad PF,6+ Mos 01/26/2013, 01/24/2014, 01/15/2015, 01/21/2016, 01/07/2017, 01/12/2018, 01/10/2019  . PFIZER(Purple Top)SARS-COV-2 Vaccination 07/10/2019, 08/10/2019  . Td 06/04/2006    Past Medical History:  Diagnosis Date  . Anxiety   . Anxiety and depression   . C. difficile diarrhea   . Depression   . Esophageal reflux   . PONV (postoperative nausea and vomiting)    Past Surgical History:  Procedure Laterality Date  . AUGMENTATION MAMMAPLASTY Bilateral 2006   Saline, In front of the muscle   . CESAREAN SECTION    . COLONOSCOPY    . COLONOSCOPY  05/16/2012   Procedure: COLONOSCOPY;  Surgeon: Iva Boop, MD;  Location: [facility] ENDOSCOPY;  Service: Endoscopy;  Laterality: N/A;  fecal transplant  . LUMBAR FUSION  1994, 1997   x 2, T11-L3  . TONSILLECTOMY    . UPPER GASTROINTESTINAL ENDOSCOPY     Current Outpatient Medications on File Prior to Visit  Medication Sig Dispense Refill  . ALPRAZolam (XANAX) 0.5 MG tablet TAKE 1 TABLET(0.5  MG) BY MOUTH THREE TIMES DAILY AS NEEDED FOR ANXIETY 30 tablet 0  . citalopram (CELEXA) 20 MG tablet TAKE 1 TABLET BY MOUTH DAILY 90 tablet 0  . traZODone (DESYREL) 50 MG tablet Take 0.5-1 tablets (25-50 mg total) by mouth at bedtime as needed for sleep. 30 tablet 3   No current facility-administered medications on file prior to visit.   Allergies  Allergen Reactions  . Codeine Nausea And Vomiting  . Hydrocodone-Acetaminophen     REACTION: Palpitations  . Morphine     REACTION: Nausea  . Nitrofurantoin     REACTION: flu-like symtoms   Social History   Socioeconomic History  . Marital status: Married    Spouse name: Not on file  . Number of children: 1  . Years of education: Not on file  . Highest education level: Not on file  Occupational History  . Occupation: Event organiser: [employer]  Tobacco Use  . Smoking status: Never Smoker  . Smokeless tobacco: Never Used  Substance and Sexual Activity  . Alcohol use: No  . Drug use: No  . Sexual activity: Not on file  Other Topics Concern  . Not on file  Social History Narrative  . Not on file   Social Determinants of Health   Financial Resource Strain: Not on file  Food Insecurity: Not on file  Transportation Needs: Not on file  Physical Activity: Not on file  Stress: Not on file  Social Connections: Not on file  Intimate Partner Violence: Not on file      Review of Systems  All other systems reviewed and are negative.      Objective:   Physical Exam Vitals reviewed.  Constitutional:      General: She is not in acute distress.    Appearance: Normal appearance. She is well-developed and normal weight. She is not ill-appearing, toxic-appearing or diaphoretic.  HENT:     Head: Normocephalic and atraumatic.     Right Ear: Tympanic membrane, ear canal and external ear normal. There is no impacted cerumen.     Left Ear: Tympanic membrane, ear canal and external ear normal. There is no impacted  cerumen.     Nose: Nose normal. No congestion or rhinorrhea.     Mouth/Throat:     Mouth: Mucous membranes are moist.     Pharynx: Oropharynx is clear. No oropharyngeal exudate or posterior oropharyngeal erythema.  Eyes:     General: No scleral icterus.       Right eye: No discharge.        Left eye: No discharge.     Extraocular Movements: Extraocular movements intact.     Conjunctiva/sclera: Conjunctivae normal.     Pupils: Pupils are equal, round, and reactive to light.  Neck:     Thyroid: No thyromegaly.     Vascular: No carotid bruit.  Cardiovascular:     Rate and Rhythm: Normal rate and regular rhythm.     Heart sounds: Normal heart sounds. No murmur heard. No friction rub. No gallop.   Pulmonary:     Effort: Pulmonary effort is normal. No respiratory distress.     Breath sounds: Normal breath sounds. No stridor. No wheezing, rhonchi or rales.  Chest:     Chest wall: No tenderness.  Abdominal:     General: Bowel sounds are normal. There is no distension.     Palpations: Abdomen is soft. There is no mass.     Tenderness: There is no abdominal tenderness. There is no guarding or rebound.     Hernia: No hernia is present.  Musculoskeletal:     Cervical back: Normal range of motion and neck supple. No rigidity.     Right lower leg: No edema.     Left lower leg: No edema.  Lymphadenopathy:     Cervical: No cervical adenopathy.  Skin:    General: Skin is warm.     Coloration: Skin is not jaundiced or pale.     Findings: No bruising, erythema, lesion or rash.  Neurological:     General: No focal deficit present.     Mental Status: She is alert and oriented to person, place, and time. Mental status is at baseline.     Cranial Nerves: No cranial nerve deficit.     Sensory: No sensory deficit.     Motor: No weakness.     Coordination: Coordination normal.     Gait: Gait normal.     Deep Tendon Reflexes: Reflexes normal.  Psychiatric:        Mood and Affect: Mood normal.         Behavior: Behavior normal.        Thought Content: Thought content normal.        Judgment: Judgment normal.           Assessment & Plan:  General medical exam - Plan: CBC with Differential/Platelet, COMPLETE  METABOLIC PANEL WITH GFR, Lipid panel  Her physical exam is completely normal.  I will check a CBC, CMP, fasting lipid panel.  Colonoscopy is due in 2024.  Mammogram is due in August but patient is already scheduled.  Pap smear was performed by her gynecologist.  Recommend a bone density test when she turns 59.  Recommended 1200 mg a day of calcium and 1000 units a day of vitamin D.  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

## 2020-09-18 LAB — CBC WITH DIFFERENTIAL/PLATELET
Absolute Monocytes: 339 cells/uL (ref 200–950)
Basophils Absolute: 58 cells/uL (ref 0–200)
Basophils Relative: 1.1 %
Eosinophils Absolute: 32 cells/uL (ref 15–500)
Eosinophils Relative: 0.6 %
HCT: 43 % (ref 35.0–45.0)
Hemoglobin: 14 g/dL (ref 11.7–15.5)
Lymphs Abs: 1219 cells/uL (ref 850–3900)
MCH: 29.7 pg (ref 27.0–33.0)
MCHC: 32.6 g/dL (ref 32.0–36.0)
MCV: 91.1 fL (ref 80.0–100.0)
MPV: 10.9 fL (ref 7.5–12.5)
Monocytes Relative: 6.4 %
Neutro Abs: 3652 cells/uL (ref 1500–7800)
Neutrophils Relative %: 68.9 %
Platelets: 245 10*3/uL (ref 140–400)
RBC: 4.72 10*6/uL (ref 3.80–5.10)
RDW: 12.4 % (ref 11.0–15.0)
Total Lymphocyte: 23 %
WBC: 5.3 10*3/uL (ref 3.8–10.8)

## 2020-09-18 LAB — COMPLETE METABOLIC PANEL WITH GFR
AG Ratio: 2 (calc) (ref 1.0–2.5)
ALT: 18 U/L (ref 6–29)
AST: 23 U/L (ref 10–35)
Albumin: 4.6 g/dL (ref 3.6–5.1)
Alkaline phosphatase (APISO): 46 U/L (ref 37–153)
BUN: 18 mg/dL (ref 7–25)
CO2: 29 mmol/L (ref 20–32)
Calcium: 10 mg/dL (ref 8.6–10.4)
Chloride: 104 mmol/L (ref 98–110)
Creat: 0.72 mg/dL (ref 0.50–1.05)
GFR, Est African American: 112 mL/min/{1.73_m2} (ref >=60)
GFR, Est Non African American: 97 mL/min/{1.73_m2} (ref >=60)
Globulin: 2.3 g/dL (calc) (ref 1.9–3.7)
Glucose, Bld: 87 mg/dL (ref 65–99)
Potassium: 4.3 mmol/L (ref 3.5–5.3)
Sodium: 141 mmol/L (ref 135–146)
Total Bilirubin: 0.6 mg/dL (ref 0.2–1.2)
Total Protein: 6.9 g/dL (ref 6.1–8.1)

## 2020-09-18 LAB — LIPID PANEL
Cholesterol: 241 mg/dL — ABNORMAL HIGH (ref <200)
HDL: 75 mg/dL (ref >=50)
LDL Cholesterol (Calc): 148 mg/dL (calc) — ABNORMAL HIGH
Non-HDL Cholesterol (Calc): 166 mg/dL (calc) — ABNORMAL HIGH (ref <130)
Total CHOL/HDL Ratio: 3.2 (calc) (ref <5.0)
Triglycerides: 76 mg/dL (ref <150)

## 2020-09-19 ENCOUNTER — Encounter: Payer: Self-pay | Admitting: Family Medicine

## 2020-10-07 DIAGNOSIS — M79644 Pain in right finger(s): Secondary | ICD-10-CM | POA: Diagnosis not present

## 2020-11-11 ENCOUNTER — Other Ambulatory Visit: Payer: Self-pay | Admitting: Obstetrics and Gynecology

## 2020-11-11 DIAGNOSIS — Z1231 Encounter for screening mammogram for malignant neoplasm of breast: Secondary | ICD-10-CM

## 2020-11-14 ENCOUNTER — Other Ambulatory Visit: Payer: Self-pay | Admitting: Family Medicine

## 2020-12-13 ENCOUNTER — Ambulatory Visit: Payer: BC Managed Care – PPO | Admitting: Family Medicine

## 2020-12-13 ENCOUNTER — Other Ambulatory Visit: Payer: Self-pay

## 2020-12-13 ENCOUNTER — Encounter: Payer: Self-pay | Admitting: Family Medicine

## 2020-12-13 VITALS — BP 104/60 | HR 78 | Temp 97.9°F | Resp 14 | Ht 66.0 in | Wt 116.0 lb

## 2020-12-13 DIAGNOSIS — F411 Generalized anxiety disorder: Secondary | ICD-10-CM

## 2020-12-13 MED ORDER — CLONAZEPAM 0.5 MG PO TABS: 0.5000 mg | ORAL_TABLET | Freq: Two times a day (BID) | ORAL | 0 refills | Status: DC | PRN

## 2020-12-13 NOTE — Progress Notes (Signed)
Subjective:    Patient ID: [name], female    DOB: December 02, 1970, 50 y.o.   MRN: [id number]  HPI Patient is very tearful during our encounter today.  She states that she is overwhelmed with anxiety.  She states that she is always worrying and she is unable to stop intrusive thoughts of something being wrong.  For instance she had C. difficile many years ago.  She recently had 1 episode of diarrhea and she became concerned with the fear that she had C. difficile again even though she realizes that is unlikely.  She has trouble sleeping.  She feels depressed.  She feels anxious all the time.  She denies any suicidal ideation or hallucinations.  She is currently taking 10 mg of Celexa daily and has been on that dose for many years.  She takes Xanax at night to help her sleep.   Past Medical History:  Diagnosis Date   Anxiety    Anxiety and depression    C. difficile diarrhea    Depression    Esophageal reflux    PONV (postoperative nausea and vomiting)    Past Surgical History:  Procedure Laterality Date   AUGMENTATION MAMMAPLASTY Bilateral 2006   Saline, In front of the muscle    CESAREAN SECTION     COLONOSCOPY     COLONOSCOPY  05/16/2012   Procedure: COLONOSCOPY;  Surgeon: Iva Boop, MD;  Location: [facility] ENDOSCOPY;  Service: Endoscopy;  Laterality: N/A;  fecal transplant   LUMBAR FUSION  1994, 1997   x 2, T11-L3   TONSILLECTOMY     UPPER GASTROINTESTINAL ENDOSCOPY     Current Outpatient Medications on File Prior to Visit  Medication Sig Dispense Refill   ALPRAZolam (XANAX) 0.5 MG tablet Take 1 tablet (0.5 mg total) by mouth at bedtime. 30 tablet 0   citalopram (CELEXA) 20 MG tablet TAKE 1 TABLET BY MOUTH DAILY 90 tablet 3   COLLAGEN PO Take by mouth.     Omega-3 Fatty Acids (FISH OIL) 1000 MG CAPS Take by mouth.     No current facility-administered medications on file prior to visit.   Allergies  Allergen Reactions   Codeine Nausea And Vomiting    Hydrocodone-Acetaminophen     REACTION: Palpitations   Morphine     REACTION: Nausea   Nitrofurantoin     REACTION: flu-like symtoms   Social History   Socioeconomic History   Marital status: Married    Spouse name: Not on file   Number of children: 1   Years of education: Not on file   Highest education level: Not on file  Occupational History   Occupation: Event organiser: [employer]  Tobacco Use   Smoking status: Never   Smokeless tobacco: Never  Substance and Sexual Activity   Alcohol use: No   Drug use: No   Sexual activity: Not on file  Other Topics Concern   Not on file  Social History Narrative   Not on file   Social Determinants of Health   Financial Resource Strain: Not on file  Food Insecurity: Not on file  Transportation Needs: Not on file  Physical Activity: Not on file  Stress: Not on file  Social Connections: Not on file  Intimate Partner Violence: Not on file     Review of Systems  All other systems reviewed and are negative.     Objective:   Physical Exam Vitals reviewed.  Constitutional:  Appearance: Normal appearance. She is normal weight.  Cardiovascular:     Rate and Rhythm: Normal rate and regular rhythm.  Pulmonary:     Effort: Pulmonary effort is normal.     Breath sounds: Normal breath sounds.  Neurological:     General: No focal deficit present.     Mental Status: She is alert and oriented to person, place, and time.  Psychiatric:        Mood and Affect: Mood normal.        Behavior: Behavior normal.        Thought Content: Thought content normal.        Judgment: Judgment normal.          Assessment & Plan:  GAD (generalized anxiety disorder) Increase Celexa to 20 mg a day and begin Klonopin 0.5 mg twice daily.  Reassess in 3 to 4 weeks.  Discontinue Xanax.  Hopefully in 3 to 4 weeks as the Celexa takes effect, we can wean away from the Klonopin.

## 2020-12-16 ENCOUNTER — Encounter: Payer: Self-pay | Admitting: Family Medicine

## 2020-12-31 ENCOUNTER — Telehealth: Payer: Self-pay | Admitting: Family Medicine

## 2020-12-31 NOTE — Telephone Encounter (Signed)
Patient called to ask if provider will change her back to the Xanax to use as needed for sleep. Please advise at [phone].

## 2021-01-01 NOTE — Telephone Encounter (Signed)
Call placed to patient. LMTRC.  

## 2021-01-02 ENCOUNTER — Other Ambulatory Visit: Payer: Self-pay | Admitting: Family Medicine

## 2021-01-02 MED ORDER — ALPRAZOLAM 0.5 MG PO TABS: 0.5000 mg | ORAL_TABLET | Freq: Every day | ORAL | 0 refills | Status: DC

## 2021-01-02 NOTE — Telephone Encounter (Signed)
Patient returned call.   Reports that she tried taking the Clonazepam to help her sleep, but noted she was very tired throughout the next day and felt that she was unable to focus.   Requested to resume Xanax as she did not have the extended effects of the medication through the next day.   Please advise.

## 2021-01-02 NOTE — Telephone Encounter (Signed)
Call placed to patient and patient made aware.  

## 2021-01-10 ENCOUNTER — Other Ambulatory Visit: Payer: Self-pay

## 2021-01-10 ENCOUNTER — Ambulatory Visit
Admission: RE | Admit: 2021-01-10 | Discharge: 2021-01-10 | Disposition: A | Discharge status: Home / Self Care | Payer: BC Managed Care – PPO | Source: Ambulatory Visit | Attending: Obstetrics and Gynecology | Admitting: Obstetrics and Gynecology

## 2021-01-10 DIAGNOSIS — Z1231 Encounter for screening mammogram for malignant neoplasm of breast: Secondary | ICD-10-CM

## 2021-01-24 ENCOUNTER — Ambulatory Visit: Payer: BC Managed Care – PPO | Admitting: Nurse Practitioner

## 2021-01-24 ENCOUNTER — Other Ambulatory Visit: Payer: Self-pay

## 2021-01-24 DIAGNOSIS — R102 Pelvic and perineal pain: Secondary | ICD-10-CM | POA: Diagnosis not present

## 2021-01-24 LAB — URINALYSIS, ROUTINE W REFLEX MICROSCOPIC
Bacteria, UA: NONE SEEN /HPF
Bilirubin Urine: NEGATIVE
Glucose, UA: NEGATIVE
Hgb urine dipstick: NEGATIVE
Hyaline Cast: NONE SEEN /LPF
Ketones, ur: NEGATIVE
Leukocytes,Ua: NEGATIVE
Nitrite: NEGATIVE
Protein, ur: NEGATIVE
RBC / HPF: NONE SEEN /HPF (ref 0–2)
Specific Gravity, Urine: 1.02 (ref 1.001–1.035)
pH: 6.5 (ref 5.0–8.0)

## 2021-01-24 NOTE — Progress Notes (Signed)
Subjective:    Patient ID: [name], female    DOB: 1970-11-12, 50 y.o.   MRN: [id number]  HPI: [name] is a 50 y.o. female presenting virtually for urinary pressure.  Chief Complaint  Patient presents with   Urinary Urgency   URINARY SYMPTOMS Patient reports this morning, she woke up with some urinary and suprapubic pressure.  She went to the drugstore and bought 2 UTI detector kits.  She reports the first one showed positive leukocyte, negative nitrite.  The second one showed negative leukocytes and negative nitrite.   Duration: day Dysuria: no Urinary frequency: yes Urgency: yes; this is not new for her Small volume voids: no Symptom severity: mild Urinary incontinence: no Foul odor: no Hematuria: no Abdominal pain: no Back pain: no Suprapubic pain/pressure: yes Flank pain: no Fever:  no Nausea: no Vomiting: no Status: stable Previous urinary tract infection: yes; long time ago Recurrent urinary tract infection: no Sexual activity: sexually active, urinates after sex Vaginal discharge: no Treatments attempted: Increasing water, Azo  Allergies  Allergen Reactions   Codeine Nausea And Vomiting   Hydrocodone-Acetaminophen     REACTION: Palpitations   Morphine     REACTION: Nausea   Nitrofurantoin     REACTION: flu-like symtoms    Outpatient Encounter Medications as of 01/24/2021  Medication Sig   ALPRAZolam (XANAX) 0.5 MG tablet Take 1 tablet (0.5 mg total) by mouth at bedtime.   citalopram (CELEXA) 20 MG tablet TAKE 1 TABLET BY MOUTH DAILY   COLLAGEN PO Take by mouth.   Omega-3 Fatty Acids (FISH OIL) 1000 MG CAPS Take by mouth.   No facility-administered encounter medications on file as of 01/24/2021.    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 difficile diarrhea    Depression    Esophageal reflux    PONV (postoperative nausea and vomiting)     Relevant past medical, surgical, family and social history reviewed and updated as indicated. Interim medical history since our last visit reviewed.  Review of Systems Per HPI unless specifically indicated above     Objective:    LMP 04/06/2018 (Approximate)   Wt Readings from Last 3 Encounters:  12/13/20 116 lb (52.6 kg)  09/17/20 115 lb (52.2 kg)  12/26/19 119 lb (54 kg)    Physical Exam Physical examination unable to be performed today due to lack of equipment.      Assessment & Plan:  1. Suprapubic pressure Acute.  Urine dipstick today completely normal.  Microscopic exam reveals 0-5 white blood cells, 0-5 squamous epithelial cells.  Discussed these findings with the patient.  Symptoms and urine testing today not definitive for urinary tract infection, so we will send the urine for culture and sensitivity.  If the patient symptoms worsen over the weekend, she should reach out to [place].  Otherwise, we will be in touch Monday and will start treatment if the culture shows an acute urinary tract infection.  Continue hydration with plenty of water.  Can continue Azo as needed for pressure.  - Urinalysis, Routine w reflex microscopic; Future - Urine  Culture; Future  Follow up plan: No follow-ups on file.  This visit was completed via telephone due to the restrictions of the COVID-19 pandemic. All issues as above were discussed and addressed but no physical exam was performed. If it was felt that the patient should be evaluated in the office, they were directed there. The patient verbally consented to this visit. Patient was unable to complete an audio/visual visit due to Lack of equipment. Location of the patient:  home Location of the provider: work Those involved with this call:  Provider: Cathlean Marseilles, DNP, FNP-C CMA: n/a Front Desk/Registration: Percival Spanish  Time spent on call:  7 minutes on the phone discussing health concerns. 15 minutes total spent in review of patient's record and preparation of their chart. I verified patient identity using two factors (patient name and date of birth). Patient consents verbally to being seen via telemedicine visit today.

## 2021-01-25 LAB — URINE CULTURE
MICRO NUMBER:: 12415573
SPECIMEN QUALITY:: ADEQUATE

## 2021-01-26 ENCOUNTER — Encounter: Payer: Self-pay | Admitting: Nurse Practitioner

## 2021-01-27 ENCOUNTER — Encounter: Payer: Self-pay | Admitting: Family Medicine

## 2021-01-27 ENCOUNTER — Other Ambulatory Visit: Payer: Self-pay

## 2021-01-27 ENCOUNTER — Ambulatory Visit: Payer: BC Managed Care – PPO | Admitting: Family Medicine

## 2021-01-27 VITALS — BP 114/72 | HR 94 | Temp 98.1°F | Resp 14 | Ht 66.0 in | Wt 116.0 lb

## 2021-01-27 DIAGNOSIS — R102 Pelvic and perineal pain: Secondary | ICD-10-CM

## 2021-01-27 LAB — URINALYSIS, ROUTINE W REFLEX MICROSCOPIC
Bacteria, UA: NONE SEEN /HPF
Bilirubin Urine: NEGATIVE
Glucose, UA: NEGATIVE
Hyaline Cast: NONE SEEN /LPF
Ketones, ur: NEGATIVE
Leukocytes,Ua: NEGATIVE
Nitrite: NEGATIVE
Protein, ur: NEGATIVE
Specific Gravity, Urine: 1.025 (ref 1.001–1.035)
WBC, UA: NONE SEEN /HPF (ref 0–5)
pH: 5.5 (ref 5.0–8.0)

## 2021-01-27 LAB — MICROSCOPIC MESSAGE

## 2021-01-27 MED ORDER — OXYBUTYNIN CHLORIDE 5 MG PO TABS: 5.0000 mg | ORAL_TABLET | Freq: Three times a day (TID) | ORAL | 0 refills | Status: DC | PRN

## 2021-01-27 NOTE — Telephone Encounter (Signed)
Looks like she has appointment with PCP today - she can discuss this then.

## 2021-01-27 NOTE — Progress Notes (Signed)
Subjective:    Patient ID: [name], female    DOB: 1970-08-09, 50 y.o.   MRN: [id number]  HPI Patient is a very pleasant 50 year old Caucasian female who saw my partner on Friday due to a pressure-like sensation in her bladder.  Urinalysis at that time was unremarkable.  Urine culture over the weekend revealed benign urogenital flora.  She presents again today complaining of pressure and urgency.  She denies any hematuria or fever or chills or nausea or vomiting.  She denies any constipation or diarrhea.  She denies any vaginal discharge or pelvic pain.  Urinalysis today is completely normal aside from some trace amount of blood. Past Medical History:  Diagnosis Date   Anxiety    Anxiety and depression    C. difficile diarrhea    Depression    Esophageal reflux    PONV (postoperative nausea and vomiting)    Past Surgical History:  Procedure Laterality Date   AUGMENTATION MAMMAPLASTY Bilateral 2006   Saline, In front of the muscle    CESAREAN SECTION     COLONOSCOPY     COLONOSCOPY  05/16/2012   Procedure: COLONOSCOPY;  Surgeon: Iva Boop, MD;  Location: [facility] ENDOSCOPY;  Service: Endoscopy;  Laterality: N/A;  fecal transplant   LUMBAR FUSION  1994, 1997   x 2, T11-L3   TONSILLECTOMY     UPPER GASTROINTESTINAL ENDOSCOPY     Current Outpatient Medications on File Prior to Visit  Medication Sig Dispense Refill   ALPRAZolam (XANAX) 0.5 MG tablet Take 1 tablet (0.5 mg total) by mouth at bedtime. 30 tablet 0   citalopram (CELEXA) 20 MG tablet TAKE 1 TABLET BY MOUTH DAILY 90 tablet 3   COLLAGEN PO Take by mouth.     Omega-3 Fatty Acids (FISH OIL) 1000 MG CAPS Take by mouth.     No current facility-administered medications on file prior to visit.   Allergies  Allergen Reactions   Codeine Nausea And Vomiting   Hydrocodone-Acetaminophen     REACTION: Palpitations   Morphine     REACTION: Nausea   Nitrofurantoin     REACTION: flu-like symtoms   Social History    Socioeconomic History   Marital status: Married    Spouse name: Not on file   Number of children: 1   Years of education: Not on file   Highest education level: Not on file  Occupational History   Occupation: Event organiser: [employer]  Tobacco Use   Smoking status: Never   Smokeless tobacco: Never  Substance and Sexual Activity   Alcohol use: No   Drug use: No   Sexual activity: Not on file  Other Topics Concern   Not on file  Social History Narrative   Not on file   Social Determinants of Health   Financial Resource Strain: Not on file  Food Insecurity: Not on file  Transportation Needs: Not on file  Physical Activity: Not on file  Stress: Not on file  Social Connections: Not on file  Intimate Partner Violence: Not on file     Review of Systems  All other systems reviewed and are negative.     Objective:   Physical Exam Vitals reviewed.  Constitutional:      Appearance: Normal appearance. She is normal weight.  Cardiovascular:     Rate and Rhythm: Normal rate and regular rhythm.     Heart sounds: Normal heart sounds. No murmur heard.   No friction rub. No  gallop.  Pulmonary:     Effort: Pulmonary effort is normal. No respiratory distress.     Breath sounds: Normal breath sounds. No wheezing, rhonchi or rales.  Abdominal:     General: Bowel sounds are normal. There is no distension.     Palpations: Abdomen is soft.     Tenderness: There is no abdominal tenderness. There is no guarding or rebound.  Neurological:     Mental Status: She is alert.  Psychiatric:        Mood and Affect: Mood normal.        Behavior: Behavior normal.        Thought Content: Thought content normal.        Judgment: Judgment normal.          Assessment & Plan:  Suprapubic pressure - Plan: Urinalysis, Routine w reflex microscopic Urinalysis today is reassuring.  I feel that the patient may be dealing with interstitial cystitis.  The other possibility on  the differential diagnosis would be a pressure on the bladder from a physical mass.  Recommended a pelvic exam but the patient politely declined.  She states that she has her pelvic exam scheduled with her gynecologist coming up.  Recommended trying oxybutynin 5 mg every 8 hours as needed but if symptoms or not improving, I would recommend a pelvic exam and/or imaging to rule out a physical object pressing against the bladder such as a uterine fibroid, etc.

## 2021-01-28 ENCOUNTER — Telehealth: Payer: Self-pay | Admitting: Family Medicine

## 2021-01-28 ENCOUNTER — Encounter: Payer: Self-pay | Admitting: Family Medicine

## 2021-01-28 ENCOUNTER — Other Ambulatory Visit: Payer: Self-pay | Admitting: Family Medicine

## 2021-01-28 DIAGNOSIS — B373 Candidiasis of vulva and vagina: Secondary | ICD-10-CM | POA: Diagnosis not present

## 2021-01-28 DIAGNOSIS — R102 Pelvic and perineal pain: Secondary | ICD-10-CM | POA: Diagnosis not present

## 2021-01-28 DIAGNOSIS — R3989 Other symptoms and signs involving the genitourinary system: Secondary | ICD-10-CM

## 2021-01-28 NOTE — Telephone Encounter (Signed)
Patient called to accept Pickard's offer to refer her to a urologist. Also wants to follow up on and receive results of urine culture taken yesterday. Requesting call back today if possible. Please advise at [phone].

## 2021-01-28 NOTE — Telephone Encounter (Signed)
Duplicate.   Message handled in MyChart encounter.

## 2021-01-29 ENCOUNTER — Encounter: Payer: Self-pay | Admitting: Family Medicine

## 2021-01-29 ENCOUNTER — Encounter: Payer: Self-pay | Admitting: Nurse Practitioner

## 2021-01-29 NOTE — Telephone Encounter (Signed)
Patient called and sent message via MyChart to follow up on urine culture; requesting call back asap at [phone]. Please advise.

## 2021-01-29 NOTE — Telephone Encounter (Signed)
I called and spoke with the patient.  I do not see where a second urine culture has resulted or was done.

## 2021-02-03 ENCOUNTER — Encounter: Payer: Self-pay | Admitting: Family Medicine

## 2021-02-03 ENCOUNTER — Other Ambulatory Visit: Payer: Self-pay | Admitting: Family Medicine

## 2021-02-08 ENCOUNTER — Other Ambulatory Visit: Payer: Self-pay | Admitting: Family Medicine

## 2021-02-10 ENCOUNTER — Other Ambulatory Visit: Payer: Self-pay | Admitting: Family Medicine

## 2021-02-10 NOTE — Telephone Encounter (Signed)
Ok to refill??  Last office visit 01/27/2021.  Last refill 01/02/2021.

## 2021-02-20 DIAGNOSIS — N760 Acute vaginitis: Secondary | ICD-10-CM | POA: Diagnosis not present

## 2021-02-20 DIAGNOSIS — N941 Unspecified dyspareunia: Secondary | ICD-10-CM | POA: Diagnosis not present

## 2021-02-20 DIAGNOSIS — R102 Pelvic and perineal pain: Secondary | ICD-10-CM | POA: Diagnosis not present

## 2021-02-25 DIAGNOSIS — D225 Melanocytic nevi of trunk: Secondary | ICD-10-CM | POA: Diagnosis not present

## 2021-02-25 DIAGNOSIS — L821 Other seborrheic keratosis: Secondary | ICD-10-CM | POA: Diagnosis not present

## 2021-02-25 DIAGNOSIS — D2221 Melanocytic nevi of right ear and external auricular canal: Secondary | ICD-10-CM | POA: Diagnosis not present

## 2021-02-25 DIAGNOSIS — Z85828 Personal history of other malignant neoplasm of skin: Secondary | ICD-10-CM | POA: Diagnosis not present

## 2021-02-27 DIAGNOSIS — N898 Other specified noninflammatory disorders of vagina: Secondary | ICD-10-CM | POA: Diagnosis not present

## 2021-02-27 DIAGNOSIS — B3731 Acute candidiasis of vulva and vagina: Secondary | ICD-10-CM | POA: Diagnosis not present

## 2021-03-11 DIAGNOSIS — Z01419 Encounter for gynecological examination (general) (routine) without abnormal findings: Secondary | ICD-10-CM | POA: Diagnosis not present

## 2021-03-11 DIAGNOSIS — N898 Other specified noninflammatory disorders of vagina: Secondary | ICD-10-CM | POA: Diagnosis not present

## 2021-03-11 DIAGNOSIS — Z124 Encounter for screening for malignant neoplasm of cervix: Secondary | ICD-10-CM | POA: Diagnosis not present

## 2021-03-11 DIAGNOSIS — Z681 Body mass index (BMI) 19 or less, adult: Secondary | ICD-10-CM | POA: Diagnosis not present

## 2021-04-01 ENCOUNTER — Ambulatory Visit: Payer: BC Managed Care – PPO | Admitting: Urology

## 2021-05-13 ENCOUNTER — Other Ambulatory Visit: Payer: Self-pay | Admitting: Family Medicine

## 2021-05-19 ENCOUNTER — Other Ambulatory Visit: Payer: Self-pay | Admitting: Family Medicine

## 2021-05-19 ENCOUNTER — Telehealth: Payer: Self-pay

## 2021-05-19 MED ORDER — PERMETHRIN 5 % EX CREA: 1.0000 "application " | TOPICAL_CREAM | Freq: Once | CUTANEOUS | 0 refills | Status: AC

## 2021-05-19 NOTE — Telephone Encounter (Signed)
Pt called to advise her daughter, [name], was seen today and given permethrin for possible scabies. Pt reports her daughter slept with her in her bed last night. Pt is very concerned about also contracting scabies. She would like treatment sent to pharmacy for her as well. Pt denies any current rashes, itching or other s/s.   Please advise, thanks!

## 2021-05-21 DIAGNOSIS — Z85828 Personal history of other malignant neoplasm of skin: Secondary | ICD-10-CM | POA: Diagnosis not present

## 2021-05-21 DIAGNOSIS — L249 Irritant contact dermatitis, unspecified cause: Secondary | ICD-10-CM | POA: Diagnosis not present

## 2021-05-21 DIAGNOSIS — L282 Other prurigo: Secondary | ICD-10-CM | POA: Diagnosis not present

## 2021-05-26 ENCOUNTER — Encounter: Payer: Self-pay | Admitting: Family Medicine

## 2021-09-19 ENCOUNTER — Ambulatory Visit: Payer: BC Managed Care – PPO | Admitting: Family Medicine

## 2021-09-19 VITALS — BP 100/78 | HR 60 | Temp 97.9°F | Ht 66.0 in | Wt 122.8 lb

## 2021-09-19 DIAGNOSIS — R002 Palpitations: Secondary | ICD-10-CM

## 2021-09-19 DIAGNOSIS — E7800 Pure hypercholesterolemia, unspecified: Secondary | ICD-10-CM

## 2021-09-19 NOTE — Progress Notes (Signed)
Subjective:    Patient ID: [name], female    DOB: 1970-11-30, 51 y.o.   MRN: [id number]  HPI Patient is a very pleasant 51 year old Caucasian female who presents today reporting palpitations.  She states that she has recently noticed an irregular heartbeat.  It occurs worse at night when she is trying to sleep.  She describes it as a "flip-flop" sensation in her chest.  It occurs suddenly and sporadically and lasts just a second or so.  She denies any syncope or presyncope or chest pain or shortness of breath.  She denies any persistent tachycardia or arrhythmia the lasting seconds to minutes.  She states that she has had them before.  She denies drinking excessive caffeine or using any kind of stimulants.  She does occasionally have anxiety and insomnia but otherwise denies any chemicals that would trigger irregular heartbeats Past Medical History:  Diagnosis Date   Anxiety    Anxiety and depression    C. difficile diarrhea    Depression    Esophageal reflux    PONV (postoperative nausea and vomiting)    Past Surgical History:  Procedure Laterality Date   AUGMENTATION MAMMAPLASTY Bilateral 2006   Saline, In front of the muscle    CESAREAN SECTION     COLONOSCOPY     COLONOSCOPY  05/16/2012   Procedure: COLONOSCOPY;  Surgeon: Gatha Mayer, MD;  Location: [place];  Service: Endoscopy;  Laterality: N/A;  fecal transplant   LUMBAR FUSION  1994, 1997   x 2, T11-L3   TONSILLECTOMY     UPPER GASTROINTESTINAL ENDOSCOPY     Current Outpatient Medications on File Prior to Visit  Medication Sig Dispense Refill   ALPRAZolam (XANAX) 0.5 MG tablet TAKE 1 TABLET(0.5 MG) BY MOUTH AT BEDTIME 30 tablet 3   citalopram (CELEXA) 20 MG tablet TAKE 1 TABLET BY MOUTH DAILY 90 tablet 3   COLLAGEN PO Take by mouth.     Omega-3 Fatty Acids (FISH OIL) 1000 MG CAPS Take by mouth.     oxybutynin (DITROPAN) 5 MG tablet TAKE 1 TABLET(5 MG) BY MOUTH EVERY 8 HOURS AS NEEDED FOR BLADDER SPASMS 30  tablet 0   No current facility-administered medications on file prior to visit.   Allergies  Allergen Reactions   Codeine Nausea And Vomiting   Hydrocodone-Acetaminophen     REACTION: Palpitations   Morphine     REACTION: Nausea   Nitrofurantoin     REACTION: flu-like symtoms   Social History   Socioeconomic History   Marital status: Married    Spouse name: Not on file   Number of children: 1   Years of education: Not on file   Highest education level: Not on file  Occupational History   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[employer]  Tobacco Use   Smoking status: Never   Smokeless tobacco: Never  Substance and Sexual Activity   Alcohol use: No   Drug use: No   Sexual activity: Not on file  Other Topics Concern   Not on file  Social History Narrative   Not on file   Social Determinants of Health   Financial Resource Strain: Not on file  Food Insecurity: Not on file  Transportation Needs: Not on file  Physical Activity: Not on file  Stress: Not on file  Social Connections: Not on file  Intimate Partner Violence: Not on file     Review of Systems  All other systems reviewed and are negative.  Objective:   Physical Exam Vitals reviewed.  Constitutional:      Appearance: Normal appearance. She is normal weight.  Cardiovascular:     Rate and Rhythm: Normal rate and regular rhythm.  Pulmonary:     Effort: Pulmonary effort is normal.     Breath sounds: Normal breath sounds.  Neurological:     General: No focal deficit present.     Mental Status: She is alert and oriented to person, place, and time.  Psychiatric:        Mood and Affect: Mood normal.        Behavior: Behavior normal.        Thought Content: Thought content normal.        Judgment: Judgment normal.          Assessment & Plan:  Palpitations - Plan: EKG 12-Lead, CBC with Differential/Platelet, COMPLETE METABOLIC PANEL WITH GFR, TSH  Pure hypercholesterolemia - Plan:  Lipid panel Patient is EKG today shows normal sinus rhythm.  She does have an incomplete right bundle branch block but there is no evidence of ischemia or infarction.  I anticipate that the patient is having PVCs.  I tried to reassure the patient that I feel this is most likely benign.  We discussed having a cardiac monitor but at the present time the patient just chooses to monitor.  She would like to check some lab work including cholesterol.  Given the palpitations I will also like to check her thyroid.  Meanwhile I will also check a CBC and a CMP to evaluate for any electrolyte disturbances.  Assuming her lab work is normal, no further treatment is necessary.  If the arrhythmia persists or worsens, we can schedule her for cardiac monitor.

## 2021-09-20 LAB — CBC WITH DIFFERENTIAL/PLATELET
Absolute Monocytes: 399 cells/uL (ref 200–950)
Basophils Absolute: 71 cells/uL (ref 0–200)
Basophils Relative: 1.7 %
Eosinophils Absolute: 109 cells/uL (ref 15–500)
Eosinophils Relative: 2.6 %
HCT: 42.1 % (ref 35.0–45.0)
Hemoglobin: 13.9 g/dL (ref 11.7–15.5)
Lymphs Abs: 1646 cells/uL (ref 850–3900)
MCH: 30 pg (ref 27.0–33.0)
MCHC: 33 g/dL (ref 32.0–36.0)
MCV: 90.7 fL (ref 80.0–100.0)
MPV: 10.7 fL (ref 7.5–12.5)
Monocytes Relative: 9.5 %
Neutro Abs: 1974 cells/uL (ref 1500–7800)
Neutrophils Relative %: 47 %
Platelets: 219 10*3/uL (ref 140–400)
RBC: 4.64 10*6/uL (ref 3.80–5.10)
RDW: 12.4 % (ref 11.0–15.0)
Total Lymphocyte: 39.2 %
WBC: 4.2 10*3/uL (ref 3.8–10.8)

## 2021-09-20 LAB — COMPLETE METABOLIC PANEL WITH GFR
AG Ratio: 2.1 (calc) (ref 1.0–2.5)
ALT: 17 U/L (ref 6–29)
AST: 23 U/L (ref 10–35)
Albumin: 4.5 g/dL (ref 3.6–5.1)
Alkaline phosphatase (APISO): 50 U/L (ref 37–153)
BUN: 16 mg/dL (ref 7–25)
CO2: 26 mmol/L (ref 20–32)
Calcium: 9.9 mg/dL (ref 8.6–10.4)
Chloride: 106 mmol/L (ref 98–110)
Creat: 0.77 mg/dL (ref 0.50–1.03)
Globulin: 2.1 g/dL (calc) (ref 1.9–3.7)
Glucose, Bld: 81 mg/dL (ref 65–99)
Potassium: 5.1 mmol/L (ref 3.5–5.3)
Sodium: 142 mmol/L (ref 135–146)
Total Bilirubin: 0.5 mg/dL (ref 0.2–1.2)
Total Protein: 6.6 g/dL (ref 6.1–8.1)
eGFR: 93 mL/min/{1.73_m2} (ref >=60)

## 2021-09-20 LAB — LIPID PANEL
Cholesterol: 246 mg/dL — ABNORMAL HIGH (ref <200)
HDL: 69 mg/dL (ref >=50)
LDL Cholesterol (Calc): 161 mg/dL (calc) — ABNORMAL HIGH
Non-HDL Cholesterol (Calc): 177 mg/dL (calc) — ABNORMAL HIGH (ref <130)
Total CHOL/HDL Ratio: 3.6 (calc) (ref <5.0)
Triglycerides: 69 mg/dL (ref <150)

## 2021-09-20 LAB — TSH: TSH: 1.3 mIU/L

## 2021-09-22 ENCOUNTER — Other Ambulatory Visit: Payer: Self-pay | Admitting: Family Medicine

## 2021-09-22 ENCOUNTER — Encounter: Payer: Self-pay | Admitting: Family Medicine

## 2021-09-22 MED ORDER — ROSUVASTATIN CALCIUM 10 MG PO TABS: 10.0000 mg | ORAL_TABLET | Freq: Every day | ORAL | 3 refills | Status: DC

## 2021-09-23 ENCOUNTER — Encounter: Payer: Self-pay | Admitting: Family Medicine

## 2021-09-26 NOTE — Progress Notes (Signed)
Referring-Warren Tanya Nones, MD Reason for referral-palpitations  HPI: 51 year old female for evaluation of palpitations at request of Lynnea Ferrier, MD.  Patient seen by Dr. Tresa Endo in the past but not since 2018.  Holter monitor May 2018 showed sinus rhythm with PACs and PVCs.  Echocardiogram May 2018 showed normal LV function.  Laboratories May 2013 showed hemoglobin 13.9, LDL 161, potassium 5.1, creatinine 0.77, TSH 1.30.  She has had occasional palpitations for years by report.  These are described as a "floppy".  They are not sustained.  No associated symptoms.  She otherwise denies dyspnea on exertion, orthopnea, PND, pedal edema, exertional chest pain or history of syncope.  Current Outpatient Medications  Medication Sig Dispense Refill   ALPRAZolam (XANAX) 0.5 MG tablet TAKE 1 TABLET(0.5 MG) BY MOUTH AT BEDTIME 30 tablet 3   citalopram (CELEXA) 20 MG tablet TAKE 1 TABLET BY MOUTH DAILY 90 tablet 3   COLLAGEN PO Take by mouth.     Omega-3 Fatty Acids (FISH OIL) 1000 MG CAPS Take by mouth.     rosuvastatin (CRESTOR) 10 MG tablet Take 1 tablet (10 mg total) by mouth daily. 90 tablet 3   No current facility-administered medications for this visit.    Allergies  Allergen Reactions   Codeine Nausea And Vomiting   Hydrocodone-Acetaminophen     REACTION: Palpitations   Morphine     REACTION: Nausea   Nitrofurantoin     REACTION: flu-like symtoms     Past Medical History:  Diagnosis Date   Anxiety    Anxiety and depression    C. difficile diarrhea    Depression    Esophageal reflux    Hyperlipidemia    PONV (postoperative nausea and vomiting)    PVC's (premature ventricular contractions)     Past Surgical History:  Procedure Laterality Date   AUGMENTATION MAMMAPLASTY Bilateral 2006   Saline, In front of the muscle    CESAREAN SECTION     COLONOSCOPY     COLONOSCOPY  05/16/2012   Procedure: COLONOSCOPY;  Surgeon: Iva Boop, MD;  Location: [facility] ENDOSCOPY;  Service:  Endoscopy;  Laterality: N/A;  fecal transplant   LUMBAR FUSION  1994, 1997   x 2, T11-L3   TONSILLECTOMY     UPPER GASTROINTESTINAL ENDOSCOPY      Social History   Socioeconomic History   Marital status: Married    Spouse name: Not on file   Number of children: 1   Years of education: Not on file   Highest education level: Not on file  Occupational History   Occupation: Event organiser: [employer]  Tobacco Use   Smoking status: Never   Smokeless tobacco: Never  Substance and Sexual Activity   Alcohol use: No   Drug use: No   Sexual activity: Not on file  Other Topics Concern   Not on file  Social History Narrative   Not on file   Social Determinants of Health   Financial Resource Strain: Not on file  Food Insecurity: Not on file  Transportation Needs: Not on file  Physical Activity: Not on file  Stress: Not on file  Social Connections: Not on file  Intimate Partner Violence: Not on file    Family History  Problem Relation Age of Onset   Diabetes Mother    Diabetes Father    Heart attack Sister    Hypertension Sister    Stroke Sister    Breast cancer Maternal Grandmother  Lymphoma Paternal Grandmother    Colon cancer Neg Hx    Stomach cancer Neg Hx     ROS: no fevers or chills, productive cough, hemoptysis, dysphasia, odynophagia, melena, hematochezia, dysuria, hematuria, rash, seizure activity, orthopnea, PND, pedal edema, claudication. Remaining systems are negative.  Physical Exam:   Blood pressure 118/74, pulse 76, height 5\' 6"  (1.676 m), weight 121 lb (54.9 kg), last menstrual period 04/06/2018.  General:  Well developed/well nourished in NAD Skin warm/dry Patient not depressed No peripheral clubbing Back-normal HEENT-normal/normal eyelids Neck supple/normal carotid upstroke bilaterally; no bruits; no JVD; no thyromegaly chest - CTA/ normal expansion CV - RRR/normal S1 and S2; no murmurs, rubs or gallops;  PMI  nondisplaced Abdomen -NT/ND, no HSM, no mass, + bowel sounds, no bruit 2+ femoral pulses, no bruits Ext-no edema, chords, 2+ DP Neuro-grossly nonfocal  ECG -Sep 19, 2021-sinus bradycardia, left axis deviation, incomplete right bundle branch block.  Personally reviewed  A/P  1 palpitations-symptoms sound likely to be PACs or PVCs.  This was demonstrated on previous Holter monitor as well.  We will plan to repeat echocardiogram to reassess LV function.  Recent TSH is normal.  We discussed the potential for beta-blockade today.  She would like to be conservative at this point but we can consider in the future if her symptoms worsen.  We can also consider repeating a monitor in the future if her symptoms worsen.  2 hyperlipidemia-recently initiated on Crestor.  Note she has a family history of coronary disease (sister has had a previous myocardial infarction).  I will arrange a calcium score for risk stratification.  If significant calcium demonstrated we will advance her Crestor.  Sep 21, 2021, MD

## 2021-10-06 DIAGNOSIS — H10412 Chronic giant papillary conjunctivitis, left eye: Secondary | ICD-10-CM | POA: Diagnosis not present

## 2021-10-06 DIAGNOSIS — H1132 Conjunctival hemorrhage, left eye: Secondary | ICD-10-CM | POA: Diagnosis not present

## 2021-10-07 ENCOUNTER — Ambulatory Visit: Payer: BC Managed Care – PPO | Admitting: Cardiology

## 2021-10-07 ENCOUNTER — Encounter: Payer: Self-pay | Admitting: Cardiology

## 2021-10-07 VITALS — BP 118/74 | HR 76 | Ht 66.0 in | Wt 121.0 lb

## 2021-10-07 DIAGNOSIS — R002 Palpitations: Secondary | ICD-10-CM

## 2021-10-07 DIAGNOSIS — E7800 Pure hypercholesterolemia, unspecified: Secondary | ICD-10-CM

## 2021-10-07 NOTE — Patient Instructions (Signed)
  Testing/Procedures:  Your physician has requested that you have an echocardiogram. Echocardiography is a painless test that uses sound waves to create images of your heart. It provides your doctor with information about the size and shape of your heart and how well your heart's chambers and valves are working. This procedure takes approximately one hour. There are no restrictions for this procedure. [facility]   CORONARY CALCIUM SCORING CT SCAN AT THE [facility]   Follow-Up: At [facility], you and your health needs are our priority.  As part of our continuing mission to provide you with exceptional heart care, we have created designated Provider Care Teams.  These Care Teams include your primary Cardiologist (physician) and Advanced Practice Providers (APPs -  Physician Assistants and Nurse Practitioners) who all work together to provide you with the care you need, when you need it.  We recommend signing up for the patient portal called "MyChart".  Sign up information is provided on this After Visit Summary.  MyChart is used to connect with patients for Virtual Visits (Telemedicine).  Patients are able to view lab/test results, encounter notes, upcoming appointments, etc.  Non-urgent messages can be sent to your provider as well.   To learn more about what you can do with MyChart, go to [email or web address].    Your next appointment:   12 month(s)  The format for your next appointment:   In Person  Provider:   Olga Millers MD      Important Information About Sugar

## 2021-10-17 ENCOUNTER — Encounter: Payer: Self-pay | Admitting: Cardiology

## 2021-10-17 ENCOUNTER — Ambulatory Visit (HOSPITAL_BASED_OUTPATIENT_CLINIC_OR_DEPARTMENT_OTHER)
Admission: RE | Admit: 2021-10-17 | Discharge: 2021-10-17 | Disposition: A | Discharge status: Home / Self Care | Payer: BC Managed Care – PPO | Source: Ambulatory Visit | Attending: Cardiology | Admitting: Cardiology

## 2021-10-17 ENCOUNTER — Ambulatory Visit (INDEPENDENT_AMBULATORY_CARE_PROVIDER_SITE_OTHER): Payer: BC Managed Care – PPO

## 2021-10-17 DIAGNOSIS — R002 Palpitations: Secondary | ICD-10-CM | POA: Diagnosis not present

## 2021-10-20 NOTE — Progress Notes (Signed)
Yes, thanks

## 2021-10-22 LAB — ECHOCARDIOGRAM COMPLETE
AR max vel: 2.22 cm2
AV Area VTI: 1.91 cm2
AV Area mean vel: 1.92 cm2
AV Mean grad: 3 mmHg
AV Peak grad: 5.2 mmHg
Ao pk vel: 1.14 m/s
Area-P 1/2: 3.91 cm2
S' Lateral: 2.83 cm
Single Plane A4C EF: 64.7 %

## 2021-11-10 DIAGNOSIS — N644 Mastodynia: Secondary | ICD-10-CM | POA: Diagnosis not present

## 2021-11-13 ENCOUNTER — Other Ambulatory Visit: Payer: Self-pay | Admitting: Family Medicine

## 2021-11-13 NOTE — Telephone Encounter (Signed)
Requested Prescriptions  Pending Prescriptions Disp Refills  . citalopram (CELEXA) 20 MG tablet [Pharmacy Med Name: CITALOPRAM 20MG  TABLETS] 90 tablet 1    Sig: TAKE 1 TABLET BY MOUTH DAILY     Psychiatry:  Antidepressants - SSRI Passed - 11/13/2021  3:07 AM      Passed - Completed PHQ-2 or PHQ-9 in the last 360 days      Passed - Valid encounter within last 6 months    Recent Outpatient Visits          1 month ago Palpitations   [facility], Tanya Nones, MD   9 months ago Suprapubic pressure   [facility], Tanya Nones, MD   9 months ago Suprapubic pressure   [facility], NP   11 months ago GAD (generalized anxiety disorder)   [facility], Olena Leatherwood, MD   1 year ago General medical exam   [facility], [facility], MD

## 2021-11-16 IMAGING — MG DIGITAL SCREENING BREAST BILAT IMPLANT W/ TOMO W/ CAD
9 of 12 series · 9 of 28 positions shown · non-contrast
Comparison: Previous exam(s).

CLINICAL DATA: Screening.

EXAM:
DIGITAL SCREENING BILATERAL MAMMOGRAM WITH IMPLANTS, CAD AND
TOMOSYNTHESIS
TECHNIQUE: Bilateral screening digital craniocaudal and mediolateral oblique
mammograms were obtained. Bilateral screening digital breast
tomosynthesis was performed. The images were evaluated with
computer-aided detection. Standard and/or implant displaced views
were performed.

[L MLO]
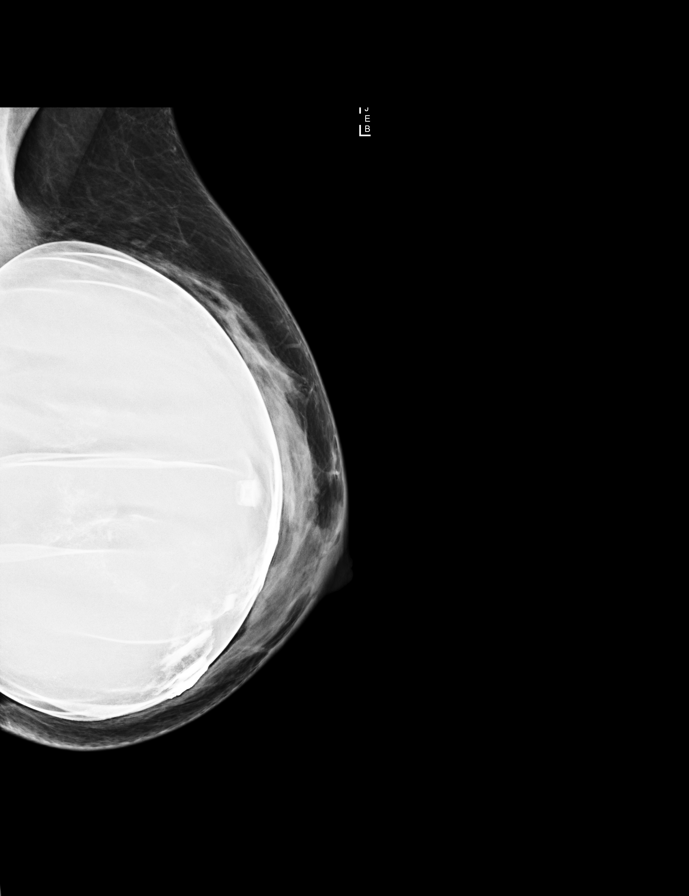

[L CC]
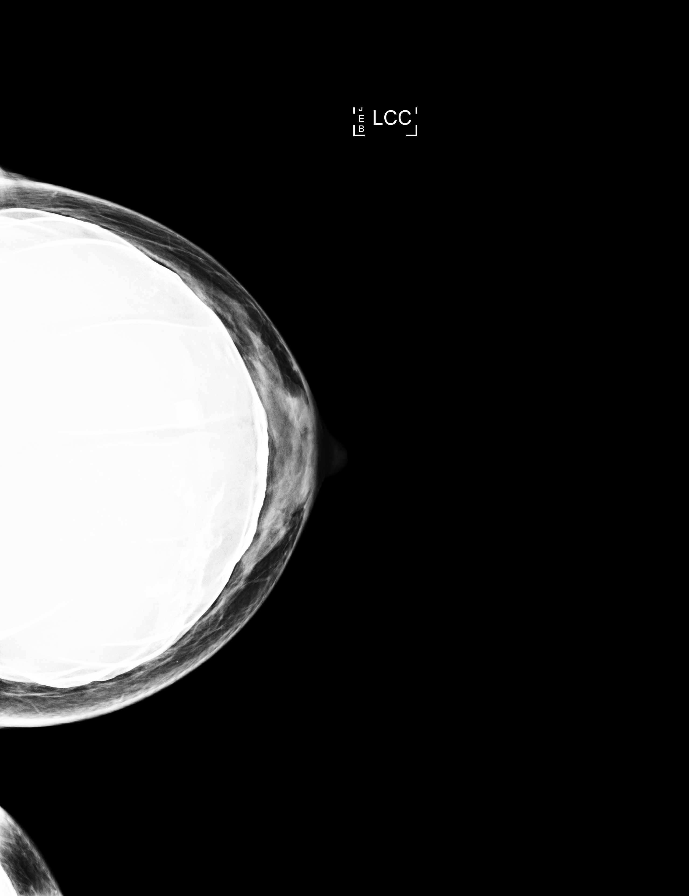

[R MLO]
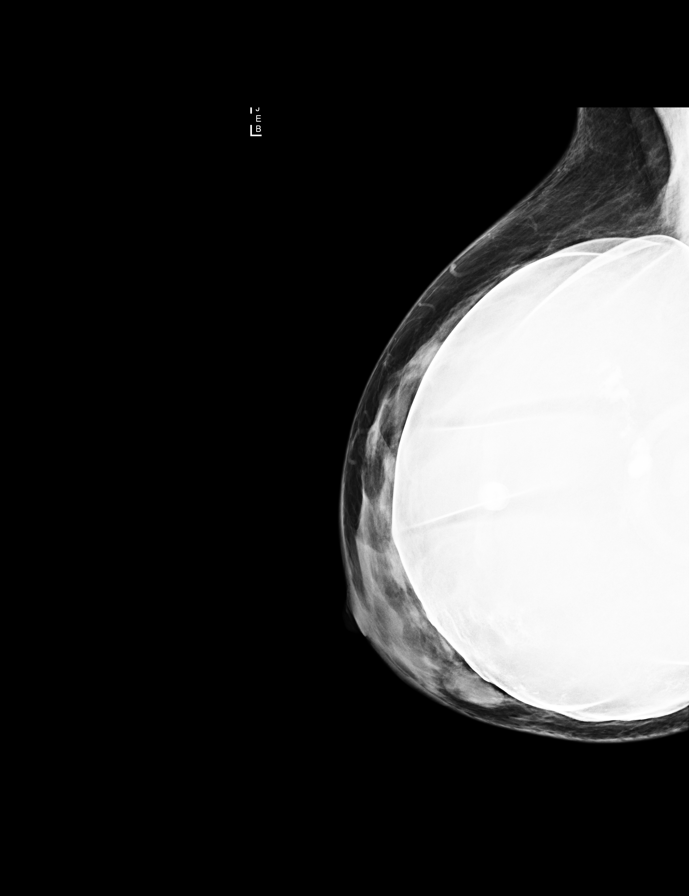

[R CC]
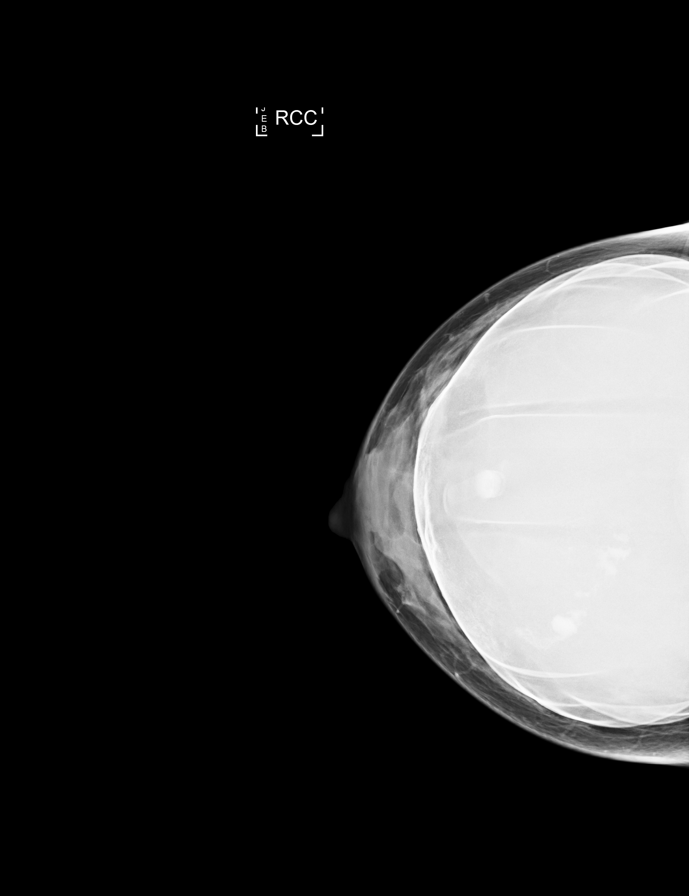

[L CC synth-2D]
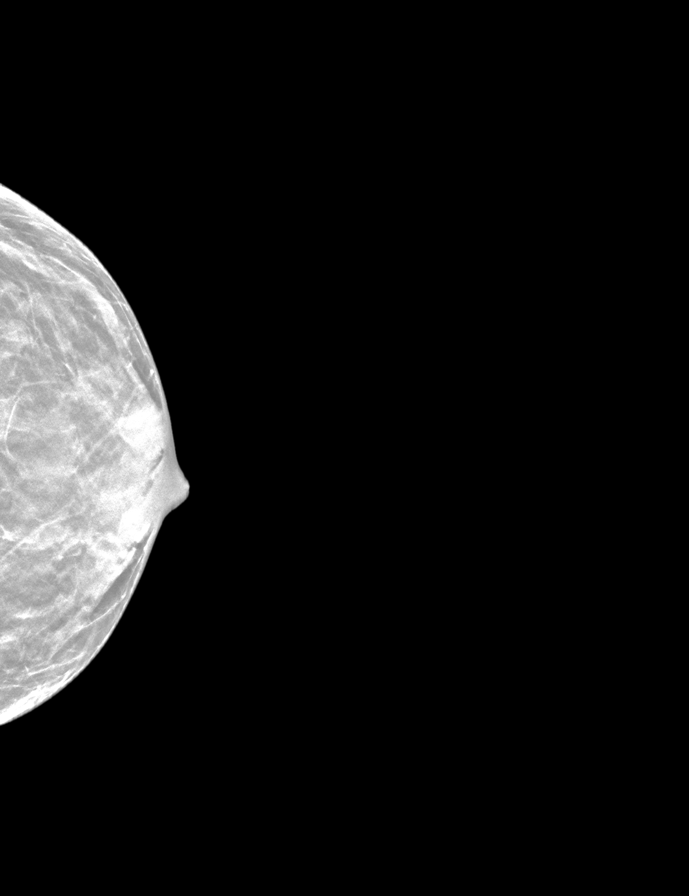

[L MLO synth-2D]
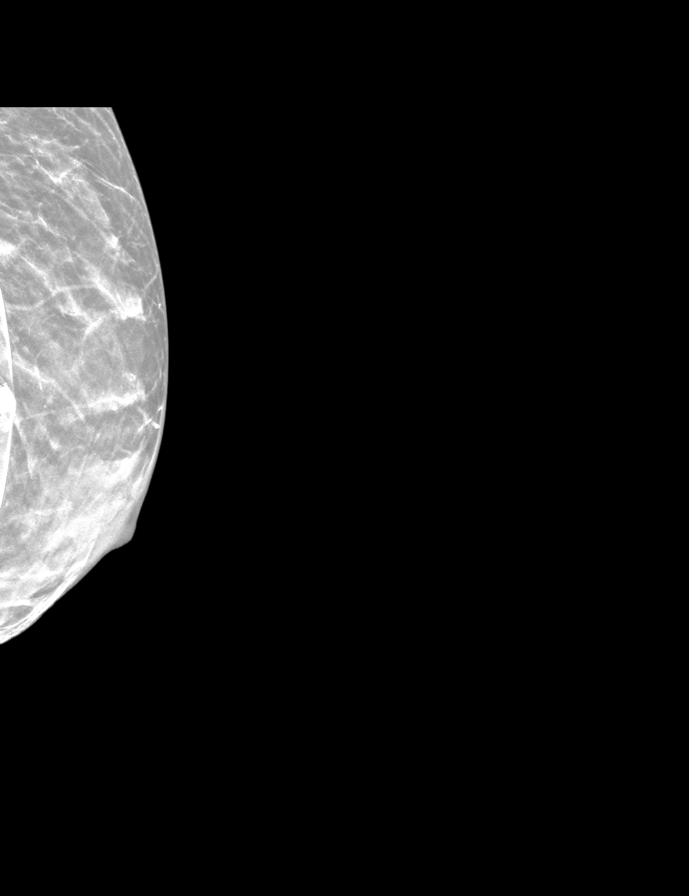

[R MLO synth-2D]
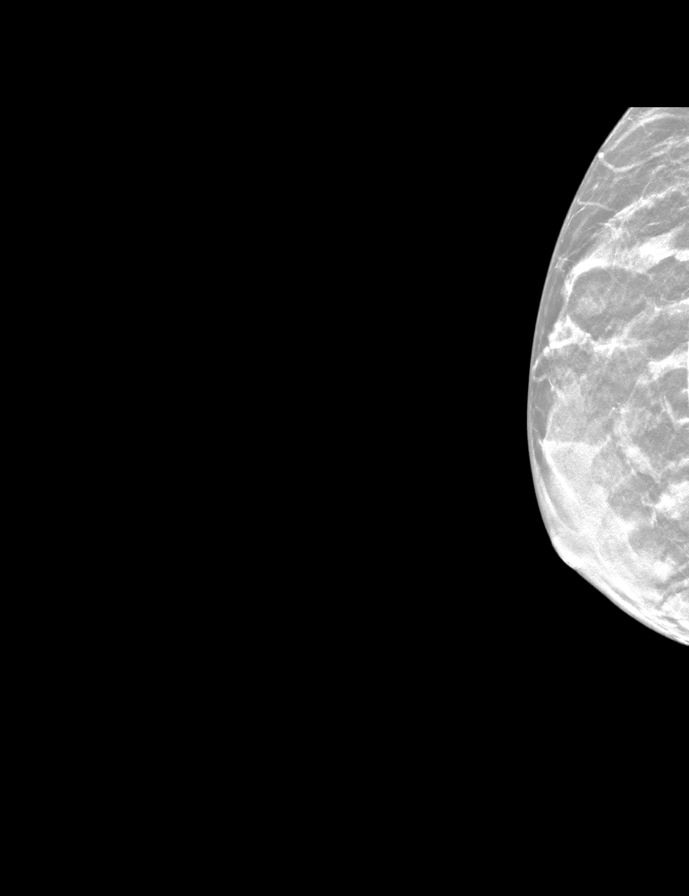

[R CC synth-2D]
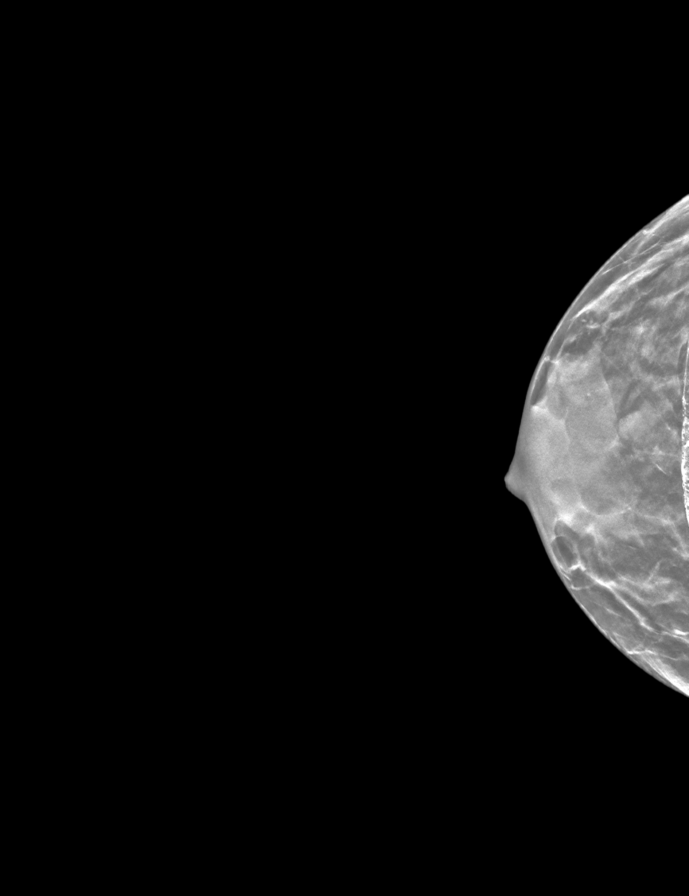

[L MLOID BREAST TOMOSYNTHESIS IMAGE tomo · tomo slice 16/31.0]
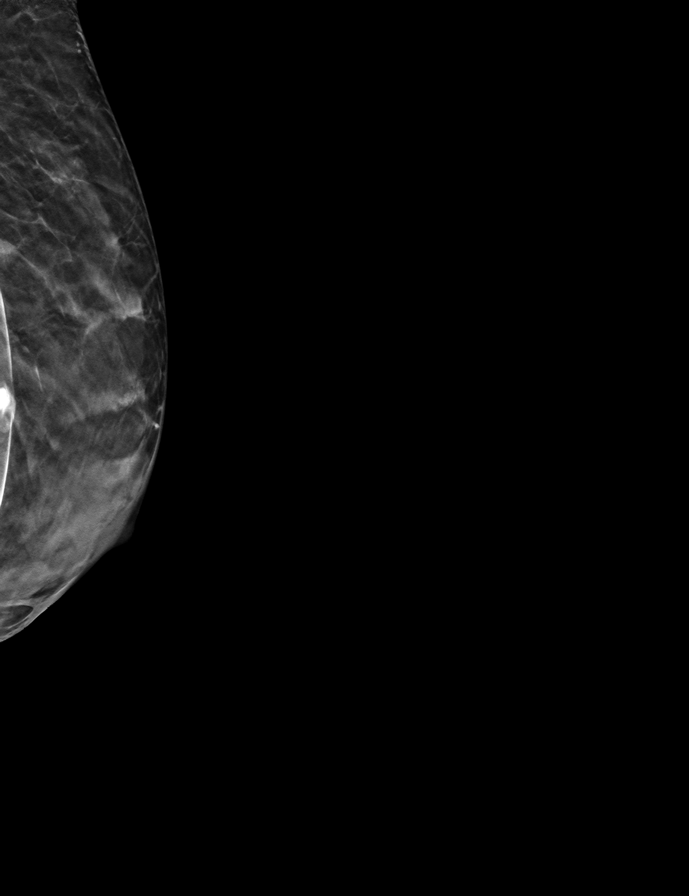

[9 of 28 positions shown; findings below may reference images not displayed]

ACR Breast Density Category c: The breast tissue is heterogeneously
dense, which may obscure small masses.
FINDINGS: The patient has implants. There are no findings suspicious for
malignancy.
IMPRESSION: No mammographic evidence of malignancy. A result letter of this
screening mammogram will be mailed directly to the patient.

RECOMMENDATION:
Screening mammogram in one year. (Code:[id number])

BI-RADS CATEGORY  1:  Negative.

## 2021-11-27 ENCOUNTER — Other Ambulatory Visit: Payer: Self-pay | Admitting: Obstetrics and Gynecology

## 2021-12-02 ENCOUNTER — Other Ambulatory Visit: Payer: Self-pay | Admitting: Obstetrics and Gynecology

## 2021-12-02 DIAGNOSIS — N644 Mastodynia: Secondary | ICD-10-CM

## 2021-12-06 ENCOUNTER — Ambulatory Visit: Payer: BC Managed Care – PPO

## 2021-12-06 ENCOUNTER — Ambulatory Visit
Admission: RE | Admit: 2021-12-06 | Discharge: 2021-12-06 | Disposition: A | Discharge status: Home / Self Care | Payer: BC Managed Care – PPO | Source: Ambulatory Visit | Attending: Obstetrics and Gynecology | Admitting: Obstetrics and Gynecology

## 2021-12-06 ENCOUNTER — Other Ambulatory Visit: Payer: Self-pay | Admitting: Obstetrics and Gynecology

## 2021-12-06 DIAGNOSIS — N644 Mastodynia: Secondary | ICD-10-CM | POA: Diagnosis not present

## 2021-12-23 ENCOUNTER — Other Ambulatory Visit: Payer: BC Managed Care – PPO

## 2021-12-23 DIAGNOSIS — E7800 Pure hypercholesterolemia, unspecified: Secondary | ICD-10-CM

## 2021-12-24 LAB — CBC WITH DIFFERENTIAL/PLATELET
Absolute Monocytes: 352 cells/uL (ref 200–950)
Basophils Absolute: 70 cells/uL (ref 0–200)
Basophils Relative: 1.6 %
Eosinophils Absolute: 79 cells/uL (ref 15–500)
Eosinophils Relative: 1.8 %
HCT: 40.4 % (ref 35.0–45.0)
Hemoglobin: 13.7 g/dL (ref 11.7–15.5)
Lymphs Abs: 1465 cells/uL (ref 850–3900)
MCH: 30.4 pg (ref 27.0–33.0)
MCHC: 33.9 g/dL (ref 32.0–36.0)
MCV: 89.8 fL (ref 80.0–100.0)
MPV: 10.7 fL (ref 7.5–12.5)
Monocytes Relative: 8 %
Neutro Abs: 2433 cells/uL (ref 1500–7800)
Neutrophils Relative %: 55.3 %
Platelets: 236 10*3/uL (ref 140–400)
RBC: 4.5 10*6/uL (ref 3.80–5.10)
RDW: 12.4 % (ref 11.0–15.0)
Total Lymphocyte: 33.3 %
WBC: 4.4 10*3/uL (ref 3.8–10.8)

## 2021-12-24 LAB — COMPREHENSIVE METABOLIC PANEL
AG Ratio: 2 (calc) (ref 1.0–2.5)
ALT: 28 U/L (ref 6–29)
AST: 32 U/L (ref 10–35)
Albumin: 4.5 g/dL (ref 3.6–5.1)
Alkaline phosphatase (APISO): 52 U/L (ref 37–153)
BUN: 16 mg/dL (ref 7–25)
CO2: 27 mmol/L (ref 20–32)
Calcium: 10 mg/dL (ref 8.6–10.4)
Chloride: 106 mmol/L (ref 98–110)
Creat: 0.83 mg/dL (ref 0.50–1.03)
Globulin: 2.2 g/dL (calc) (ref 1.9–3.7)
Glucose, Bld: 85 mg/dL (ref 65–99)
Potassium: 5.2 mmol/L (ref 3.5–5.3)
Sodium: 142 mmol/L (ref 135–146)
Total Bilirubin: 0.6 mg/dL (ref 0.2–1.2)
Total Protein: 6.7 g/dL (ref 6.1–8.1)

## 2021-12-24 LAB — LIPID PANEL
Cholesterol: 161 mg/dL (ref <200)
HDL: 71 mg/dL (ref >=50)
LDL Cholesterol (Calc): 76 mg/dL (calc)
Non-HDL Cholesterol (Calc): 90 mg/dL (calc) (ref <130)
Total CHOL/HDL Ratio: 2.3 (calc) (ref <5.0)
Triglycerides: 61 mg/dL (ref <150)

## 2022-02-11 ENCOUNTER — Other Ambulatory Visit: Payer: Self-pay | Admitting: Family Medicine

## 2022-03-12 DIAGNOSIS — Z01411 Encounter for gynecological examination (general) (routine) with abnormal findings: Secondary | ICD-10-CM | POA: Diagnosis not present

## 2022-03-12 DIAGNOSIS — Z124 Encounter for screening for malignant neoplasm of cervix: Secondary | ICD-10-CM | POA: Diagnosis not present

## 2022-03-12 DIAGNOSIS — Z01419 Encounter for gynecological examination (general) (routine) without abnormal findings: Secondary | ICD-10-CM | POA: Diagnosis not present

## 2022-03-12 DIAGNOSIS — Z681 Body mass index (BMI) 19 or less, adult: Secondary | ICD-10-CM | POA: Diagnosis not present

## 2022-03-18 ENCOUNTER — Ambulatory Visit (INDEPENDENT_AMBULATORY_CARE_PROVIDER_SITE_OTHER): Payer: BC Managed Care – PPO

## 2022-03-18 DIAGNOSIS — Z23 Encounter for immunization: Secondary | ICD-10-CM | POA: Diagnosis not present

## 2022-04-22 DIAGNOSIS — D225 Melanocytic nevi of trunk: Secondary | ICD-10-CM | POA: Diagnosis not present

## 2022-04-22 DIAGNOSIS — L821 Other seborrheic keratosis: Secondary | ICD-10-CM | POA: Diagnosis not present

## 2022-04-22 DIAGNOSIS — L570 Actinic keratosis: Secondary | ICD-10-CM | POA: Diagnosis not present

## 2022-04-22 DIAGNOSIS — Z85828 Personal history of other malignant neoplasm of skin: Secondary | ICD-10-CM | POA: Diagnosis not present

## 2022-06-08 ENCOUNTER — Telehealth: Payer: Self-pay

## 2022-06-08 ENCOUNTER — Other Ambulatory Visit: Payer: Self-pay | Admitting: Family Medicine

## 2022-06-08 ENCOUNTER — Encounter: Payer: Self-pay | Admitting: Family Medicine

## 2022-06-08 MED ORDER — ALPRAZOLAM 0.5 MG PO TABS
ORAL_TABLET | ORAL | 3 refills | Status: DC
Start: 2022-06-08 — End: 2022-12-28

## 2022-06-08 NOTE — Telephone Encounter (Signed)
My Chart message from patient:  Can I get xanax refilled for sleep Only use as needed   Pt's last rf was 05/13/2021. Thank you.

## 2022-06-09 ENCOUNTER — Other Ambulatory Visit: Payer: Self-pay | Admitting: Family Medicine

## 2022-06-10 NOTE — Telephone Encounter (Signed)
Unable to refill per protocol, Rx request is too soon. Last refill 09/22/21 for 90 and 3 refills.  Requested Prescriptions  Pending Prescriptions Disp Refills   rosuvastatin (CRESTOR) 10 MG tablet [Pharmacy Med Name: ROSUVASTATIN 10MG  TABLETS] 90 tablet 3    Sig: TAKE 1 TABLET(10 MG) BY MOUTH DAILY     Cardiovascular:  Antilipid - Statins 2 Failed - 06/09/2022  8:05 AM      Failed - Lipid Panel in normal range within the last 12 months    Cholesterol  Date Value Ref Range Status  12/23/2021 161 <200 mg/dL Final   LDL Cholesterol (Calc)  Date Value Ref Range Status  12/23/2021 76 mg/dL (calc) Final    Comment:    Reference range: <100 . Desirable range <100 mg/dL for primary prevention;   <70 mg/dL for patients with CHD or diabetic patients  with > or = 2 CHD risk factors. Marland Kitchen LDL-C is now calculated using the Martin-Hopkins  calculation, which is a validated novel method providing  better accuracy than the Friedewald equation in the  estimation of LDL-C.  Cresenciano Genre et al. Annamaria Helling. 4010;272(53): 2061-2068  (http://education.[facility].com/faq/FAQ164)    HDL  Date Value Ref Range Status  12/23/2021 71 > OR = 50 mg/dL Final   Triglycerides  Date Value Ref Range Status  12/23/2021 61 <150 mg/dL Final         Passed - Cr in normal range and within 360 days    Creat  Date Value Ref Range Status  12/23/2021 0.83 0.50 - 1.03 mg/dL Final   Creatinine,U  Date Value Ref Range Status  09/08/2008  mg/dL Final   26.5 (NOTE)  Cutoff Values for Urine Drug Screen:        Drug Class           Cutoff (ng/mL)        Amphetamines            1000        Barbiturates             200        Cocaine Metabolites      300        Benzodiazepines          200        Methadone                 300        Opiates                 2000        Phencyclidine             25        Propoxyphene             300        Marijuana Metabolites     50  For medical purposes only.         Passed - Patient is  not pregnant      Passed - Valid encounter within last 12 months    Recent Outpatient Visits           8 months ago Palpitations   [place] [facility], Cammie Mcgee, MD   1 year ago Suprapubic pressure   [place] Pick[facility], Cammie Mcgee, MD   1 year ago Suprapubic pressure   [place], NP   1 year ago GAD (generalized anxiety disorder)  [facility] Pick[facility], Cammie Mcgee, MD   1 year ago General medical exam   [place] Pick[facility], Cammie Mcgee, MD

## 2022-06-26 ENCOUNTER — Telehealth: Payer: Self-pay | Admitting: Cardiology

## 2022-06-26 NOTE — Telephone Encounter (Signed)
Patient c/o Palpitations:  High priority if patient c/o lightheadedness, shortness of breath, or chest pain  How long have you had palpitations/irregular HR/ Afib? Are you having the symptoms now?  PVC's the past 2 weeks  No symptoms currently  Are you currently experiencing lightheadedness, SOB or CP?  No   Do you have a history of afib (atrial fibrillation) or irregular heart rhythm?  Yes, hx of PVC's   Have you checked your BP or HR? (document readings if available):  No   Are you experiencing any other symptoms?  No

## 2022-06-26 NOTE — Telephone Encounter (Signed)
Pt states that she has been experiencing more "flutters" over the past few weeks. She denies any symptoms, it's just that she feels "flutters" in her chest. She was seen for this last June and had some testing done, etc... but it has recently increased and she would like to get an appointment.  Appt made for 07/16/22 at 0840 with Dr Stanford Breed; also put on cancellation list. Told to call with any questions/concerns. Go to ER if become symptomatic  Given ER precautions

## 2022-06-29 ENCOUNTER — Encounter: Payer: Self-pay | Admitting: Family Medicine

## 2022-06-29 ENCOUNTER — Encounter: Payer: Self-pay | Admitting: Cardiology

## 2022-06-29 ENCOUNTER — Ambulatory Visit: Payer: BC Managed Care – PPO | Admitting: Family Medicine

## 2022-06-29 VITALS — BP 118/72 | HR 63 | Temp 98.2°F | Ht 66.0 in | Wt 120.0 lb

## 2022-06-29 DIAGNOSIS — R002 Palpitations: Secondary | ICD-10-CM | POA: Diagnosis not present

## 2022-06-29 MED ORDER — DILTIAZEM HCL 30 MG PO TABS: 30.0000 mg | ORAL_TABLET | Freq: Four times a day (QID) | ORAL | 1 refills | Status: DC | PRN

## 2022-06-29 NOTE — Progress Notes (Signed)
Subjective:    Patient ID: [name], female    DOB: 04-Apr-1971, 52 y.o.   MRN: [id number]  Palpitations   09/19/21 Patient is a very pleasant 52 year old Caucasian female who presents today reporting palpitations.  She states that she has recently noticed an irregular heartbeat.  It occurs worse at night when she is trying to sleep.  She describes it as a "flip-flop" sensation in her chest.  It occurs suddenly and sporadically and lasts just a second or so.  She denies any syncope or presyncope or chest pain or shortness of breath.  She denies any persistent tachycardia or arrhythmia the lasting seconds to minutes.  She states that she has had them before.  She denies drinking excessive caffeine or using any kind of stimulants.  She does occasionally have anxiety and insomnia but otherwise denies any chemicals that would trigger irregular heartbeats.  AT that time, my plan was:  Patient is EKG today shows normal sinus rhythm.  She does have an incomplete right bundle branch block but there is no evidence of ischemia or infarction.  I anticipate that the patient is having PVCs.  I tried to reassure the patient that I feel this is most likely benign.  We discussed having a cardiac monitor but at the present time the patient just chooses to monitor.  She would like to check some lab work including cholesterol.  Given the palpitations I will also like to check her thyroid.  Meanwhile I will also check a CBC and a CMP to evaluate for any electrolyte disturbances.  Assuming her lab work is normal, no further treatment is necessary.  If the arrhythmia persists or worsens, we can schedule her for cardiac monitor.  06/29/22 Patient is dealing with palpitations again.  She describes them as a "flip-flop in her chest.  They occur suddenly and without warning.  She feels them radiate up into her neck.  She states that they make her feel like she needs to cough or take a deep breath.  She denies any syncope.   She denies any chest pain.  She had a coronary artery calcium score last year of 0.  She had an echocardiogram that revealed trivial mitral regurgitation but was otherwise normal.  I suspect PVCs.  She has an appoint to see her cardiologist for her annual exam Past Medical History:  Diagnosis Date   Anxiety    Anxiety and depression    C. difficile diarrhea    Depression    Esophageal reflux    Hyperlipidemia    PONV (postoperative nausea and vomiting)    PVC's (premature ventricular contractions)    Past Surgical History:  Procedure Laterality Date   AUGMENTATION MAMMAPLASTY Bilateral 2006   Saline, In front of the muscle    CESAREAN SECTION     COLONOSCOPY     COLONOSCOPY  05/16/2012   Procedure: COLONOSCOPY;  Surgeon: Gatha Mayer, MD;  Location: [place];  Service: Endoscopy;  Laterality: N/A;  fecal transplant   LUMBAR FUSION  1994, 1997   x 2, T11-L3   TONSILLECTOMY     UPPER GASTROINTESTINAL ENDOSCOPY     Current Outpatient Medications on File Prior to Visit  Medication Sig Dispense Refill   ALPRAZolam (XANAX) 0.5 MG tablet TAKE 1 TABLET(0.5 MG) BY MOUTH AT BEDTIME 30 tablet 3   citalopram (CELEXA) 20 MG tablet TAKE 1 TABLET BY MOUTH DAILY 90 tablet 1   COLLAGEN PO Take by mouth.  Omega-3 Fatty Acids (FISH OIL) 1000 MG CAPS Take by mouth.     rosuvastatin (CRESTOR) 10 MG tablet Take 1 tablet (10 mg total) by mouth daily. 90 tablet 3   No current facility-administered medications on file prior to visit.   Allergies  Allergen Reactions   Codeine Nausea And Vomiting   Hydrocodone-Acetaminophen     REACTION: Palpitations   Morphine     REACTION: Nausea   Nitrofurantoin     REACTION: flu-like symtoms   Social History   Socioeconomic History   Marital status: Married    Spouse name: Not on file   Number of children: 1   Years of education: Not on file   Highest education level: Not on file  Occupational History   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[employer]  Tobacco Use   Smoking status: Never   Smokeless tobacco: Never  Substance and Sexual Activity   Alcohol use: No   Drug use: No   Sexual activity: Not on file  Other Topics Concern   Not on file  Social History Narrative   Not on file   Social Determinants of Health   Financial Resource Strain: Not on file  Food Insecurity: Not on file  Transportation Needs: Not on file  Physical Activity: Not on file  Stress: Not on file  Social Connections: Not on file  Intimate Partner Violence: Not on file     Review of Systems  Cardiovascular:  Positive for palpitations.  All other systems reviewed and are negative.      Objective:   Physical Exam Vitals reviewed.  Constitutional:      Appearance: Normal appearance. She is normal weight.  Cardiovascular:     Rate and Rhythm: Normal rate and regular rhythm.  Pulmonary:     Effort: Pulmonary effort is normal.     Breath sounds: Normal breath sounds.  Neurological:     General: No focal deficit present.     Mental Status: She is alert and oriented to person, place, and time.  Psychiatric:        Mood and Affect: Mood normal.        Behavior: Behavior normal.        Thought Content: Thought content normal.        Judgment: Judgment normal.           Assessment & Plan:  Palpitations - Plan: EKG 12-Lead EKG today shows normal sinus rhythm with a right bundle branch block.  This is a chronic finding.  There is no evidence of ischemia or infarction.  I suspect PVCs.  These are really troubling the patient.  Therefore I recommended that we try Cardizem 30 mg every 6 hours as needed for palpitations.  I am using this because of its short half-life because she is concerned about hypotension.  We also discussed trying possible metoprolol but she is concerned that that may drop her blood pressure too much.  I recommended that she discuss this with her cardiologist to see if she would benefit from wearing a  cardiac monitor but I feel certain that these are PVCs and PACs

## 2022-06-30 ENCOUNTER — Ambulatory Visit: Payer: BC Managed Care – PPO | Admitting: Family Medicine

## 2022-07-03 ENCOUNTER — Ambulatory Visit: Payer: BC Managed Care – PPO | Admitting: Family Medicine

## 2022-07-03 ENCOUNTER — Encounter: Payer: Self-pay | Admitting: Family Medicine

## 2022-07-03 VITALS — BP 110/68 | HR 70 | Temp 97.9°F | Ht 66.0 in | Wt 121.0 lb

## 2022-07-03 DIAGNOSIS — Z1211 Encounter for screening for malignant neoplasm of colon: Secondary | ICD-10-CM

## 2022-07-03 DIAGNOSIS — E7800 Pure hypercholesterolemia, unspecified: Secondary | ICD-10-CM | POA: Diagnosis not present

## 2022-07-03 NOTE — Progress Notes (Signed)
Subjective:    Patient ID: [name], female    DOB: Nov 28, 1970, 52 y.o.   MRN: [id number]  Palpitations   09/19/21 Patient is a very pleasant 52 year old Caucasian female who presents today reporting palpitations.  She states that she has recently noticed an irregular heartbeat.  It occurs worse at night when she is trying to sleep.  She describes it as a "flip-flop" sensation in her chest.  It occurs suddenly and sporadically and lasts just a second or so.  She denies any syncope or presyncope or chest pain or shortness of breath.  She denies any persistent tachycardia or arrhythmia the lasting seconds to minutes.  She states that she has had them before.  She denies drinking excessive caffeine or using any kind of stimulants.  She does occasionally have anxiety and insomnia but otherwise denies any chemicals that would trigger irregular heartbeats.  AT that time, my plan was:  Patient is EKG today shows normal sinus rhythm.  She does have an incomplete right bundle branch block but there is no evidence of ischemia or infarction.  I anticipate that the patient is having PVCs.  I tried to reassure the patient that I feel this is most likely benign.  We discussed having a cardiac monitor but at the present time the patient just chooses to monitor.  She would like to check some lab work including cholesterol.  Given the palpitations I will also like to check her thyroid.  Meanwhile I will also check a CBC and a CMP to evaluate for any electrolyte disturbances.  Assuming her lab work is normal, no further treatment is necessary.  If the arrhythmia persists or worsens, we can schedule her for cardiac monitor.  06/29/22 Patient is dealing with palpitations again.  She describes them as a "flip-flop in her chest.  They occur suddenly and without warning.  She feels them radiate up into her neck.  She states that they make her feel like she needs to cough or take a deep breath.  She denies any syncope.   She denies any chest pain.  She had a coronary artery calcium score last year of 0.  She had an echocardiogram that revealed trivial mitral regurgitation but was otherwise normal.  I suspect PVCs.  She has an appoint to see her cardiologist for her annual exam.  At that time, my plan was: EKG today shows normal sinus rhythm with a right bundle branch block.  This is a chronic finding.  There is no evidence of ischemia or infarction.  I suspect PVCs.  These are really troubling the patient.  Therefore I recommended that we try Cardizem 30 mg every 6 hours as needed for palpitations.  I am using this because of its short half-life because she is concerned about hypotension.  We also discussed trying possible metoprolol but she is concerned that that may drop her blood pressure too much.  I recommended that she discuss this with her cardiologist to see if she would benefit from wearing a cardiac monitor but I feel certain that these are PVCs and PACs  07/03/22 Patient has not tried the Cardizem yet.  She is concerned that the medication may drop her blood pressure too much.  She is only have 1 episode of severe palpitations and that occurred after working out 1 day this week.  She is here today primarily for fasting lab work to monitor her cholesterol.  She sees her gynecologist for her mammograms and her Pap  smear.  Her last colonoscopy was in 2014 so she is due this year.  She denies any family history of colon cancer.  She denies any blood in her stool.  She does report an irritant cough that has been going well.  She denies any fevers or chills or night sweats or hemoptysis or weight loss.  She denies any wheezing or allergies.  I suspect acid reflux Past Medical History:  Diagnosis Date   Anxiety    Anxiety and depression    C. difficile diarrhea    Depression    Esophageal reflux    Hyperlipidemia    PONV (postoperative nausea and vomiting)    PVC's (premature ventricular contractions)    Past  Surgical History:  Procedure Laterality Date   AUGMENTATION MAMMAPLASTY Bilateral 2006   Saline, In front of the muscle    CESAREAN SECTION     COLONOSCOPY     COLONOSCOPY  05/16/2012   Procedure: COLONOSCOPY;  Surgeon: Gatha Mayer, MD;  Location: [place];  Service: Endoscopy;  Laterality: N/A;  fecal transplant   LUMBAR FUSION  1994, 1997   x 2, T11-L3   TONSILLECTOMY     UPPER GASTROINTESTINAL ENDOSCOPY     Current Outpatient Medications on File Prior to Visit  Medication Sig Dispense Refill   ALPRAZolam (XANAX) 0.5 MG tablet TAKE 1 TABLET(0.5 MG) BY MOUTH AT BEDTIME 30 tablet 3   citalopram (CELEXA) 20 MG tablet TAKE 1 TABLET BY MOUTH DAILY 90 tablet 1   COLLAGEN PO Take by mouth.     diltiazem (CARDIZEM) 30 MG tablet Take 1 tablet (30 mg total) by mouth 4 (four) times daily as needed. 30 tablet 1   Omega-3 Fatty Acids (FISH OIL) 1000 MG CAPS Take by mouth.     rosuvastatin (CRESTOR) 10 MG tablet Take 1 tablet (10 mg total) by mouth daily. 90 tablet 3   No current facility-administered medications on file prior to visit.   Allergies  Allergen Reactions   Codeine Nausea And Vomiting   Hydrocodone-Acetaminophen     REACTION: Palpitations   Morphine     REACTION: Nausea   Nitrofurantoin     REACTION: flu-like symtoms   Social History   Socioeconomic History   Marital status: Married    Spouse name: Not on file   Number of children: 1   Years of education: Not on file   Highest education level: Not on file  Occupational History   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[employer]  Tobacco Use   Smoking status: Never   Smokeless tobacco: Never  Substance and Sexual Activity   Alcohol use: No   Drug use: No   Sexual activity: Not on file  Other Topics Concern   Not on file  Social History Narrative   Not on file   Social Determinants of Health   Financial Resource Strain: Not on file  Food Insecurity: Not on file  Transportation Needs: Not on file   Physical Activity: Not on file  Stress: Not on file  Social Connections: Not on file  Intimate Partner Violence: Not on file     Review of Systems  Cardiovascular:  Positive for palpitations.  All other systems reviewed and are negative.      Objective:   Physical Exam Vitals reviewed.  Constitutional:      Appearance: Normal appearance. She is normal weight.  Cardiovascular:     Rate and Rhythm: Normal rate and regular rhythm.  Pulmonary:  Effort: Pulmonary effort is normal.     Breath sounds: Normal breath sounds.  Neurological:     General: No focal deficit present.     Mental Status: She is alert and oriented to person, place, and time.  Psychiatric:        Mood and Affect: Mood normal.        Behavior: Behavior normal.        Thought Content: Thought content normal.        Judgment: Judgment normal.           Assessment & Plan:  Pure hypercholesterolemia - Plan: CBC with Differential/Platelet, Lipid panel, COMPLETE METABOLIC PANEL WITH GFR  Colon cancer screening - Plan: Cologuard I will check her cholesterol today.  Check CBC CMP and a lipid panel.  Also screen the patient for colon cancer by ordering a Cologuard.  Mammogram and Pap smear I will defer to her gynecologist.  I suspect that her cough is most likely due to acid reflux.  I recommended elevating the head of her bed 1 or 2 inches to see if this will help with the call.  We discussed acid reflux medication but she politely declines.

## 2022-07-04 LAB — COMPLETE METABOLIC PANEL WITH GFR
AG Ratio: 2.1 (calc) (ref 1.0–2.5)
ALT: 18 U/L (ref 6–29)
AST: 25 U/L (ref 10–35)
Albumin: 4.7 g/dL (ref 3.6–5.1)
Alkaline phosphatase (APISO): 47 U/L (ref 37–153)
BUN: 16 mg/dL (ref 7–25)
CO2: 30 mmol/L (ref 20–32)
Calcium: 10.1 mg/dL (ref 8.6–10.4)
Chloride: 105 mmol/L (ref 98–110)
Creat: 0.81 mg/dL (ref 0.50–1.03)
Globulin: 2.2 g/dL (calc) (ref 1.9–3.7)
Glucose, Bld: 82 mg/dL (ref 65–99)
Potassium: 4.8 mmol/L (ref 3.5–5.3)
Sodium: 141 mmol/L (ref 135–146)
Total Bilirubin: 0.6 mg/dL (ref 0.2–1.2)
Total Protein: 6.9 g/dL (ref 6.1–8.1)
eGFR: 87 mL/min/{1.73_m2} (ref >=60)

## 2022-07-04 LAB — CBC WITH DIFFERENTIAL/PLATELET
Absolute Monocytes: 342 cells/uL (ref 200–950)
Basophils Absolute: 61 cells/uL (ref 0–200)
Basophils Relative: 1.6 %
Eosinophils Absolute: 80 cells/uL (ref 15–500)
Eosinophils Relative: 2.1 %
HCT: 42.3 % (ref 35.0–45.0)
Hemoglobin: 14.1 g/dL (ref 11.7–15.5)
Lymphs Abs: 1630 cells/uL (ref 850–3900)
MCH: 30.1 pg (ref 27.0–33.0)
MCHC: 33.3 g/dL (ref 32.0–36.0)
MCV: 90.2 fL (ref 80.0–100.0)
MPV: 10.7 fL (ref 7.5–12.5)
Monocytes Relative: 9 %
Neutro Abs: 1687 cells/uL (ref 1500–7800)
Neutrophils Relative %: 44.4 %
Platelets: 240 10*3/uL (ref 140–400)
RBC: 4.69 10*6/uL (ref 3.80–5.10)
RDW: 12.3 % (ref 11.0–15.0)
Total Lymphocyte: 42.9 %
WBC: 3.8 10*3/uL (ref 3.8–10.8)

## 2022-07-04 LAB — LIPID PANEL
Cholesterol: 168 mg/dL (ref <200)
HDL: 78 mg/dL (ref >=50)
LDL Cholesterol (Calc): 76 mg/dL (calc)
Non-HDL Cholesterol (Calc): 90 mg/dL (calc) (ref <130)
Total CHOL/HDL Ratio: 2.2 (calc) (ref <5.0)
Triglycerides: 56 mg/dL (ref <150)

## 2022-07-08 NOTE — Progress Notes (Deleted)
HPI: Follow-up palpitations. Holter monitor May 2018 showed sinus rhythm with PACs and PVCs.  Echocardiogram May 2018 showed normal LV function.  Calcium score June 2023 0.  Echo June 2023 showed normal LV function, mild mitral regurgitation.  Current Outpatient Medications  Medication Sig Dispense Refill   ALPRAZolam (XANAX) 0.5 MG tablet TAKE 1 TABLET(0.5 MG) BY MOUTH AT BEDTIME 30 tablet 3   citalopram (CELEXA) 20 MG tablet TAKE 1 TABLET BY MOUTH DAILY 90 tablet 1   COLLAGEN PO Take by mouth.     diltiazem (CARDIZEM) 30 MG tablet Take 1 tablet (30 mg total) by mouth 4 (four) times daily as needed. 30 tablet 1   Omega-3 Fatty Acids (FISH OIL) 1000 MG CAPS Take by mouth.     rosuvastatin (CRESTOR) 10 MG tablet Take 1 tablet (10 mg total) by mouth daily. 90 tablet 3   No current facility-administered medications for this visit.     Past Medical History:  Diagnosis Date   Anxiety    Anxiety and depression    C. difficile diarrhea    Depression    Esophageal reflux    Hyperlipidemia    PONV (postoperative nausea and vomiting)    PVC's (premature ventricular contractions)     Past Surgical History:  Procedure Laterality Date   AUGMENTATION MAMMAPLASTY Bilateral 2006   Saline, In front of the muscle    CESAREAN SECTION     COLONOSCOPY     COLONOSCOPY  05/16/2012   Procedure: COLONOSCOPY;  Surgeon: Gatha Mayer, MD;  Location: [place];  Service: Endoscopy;  Laterality: N/A;  fecal transplant   LUMBAR FUSION  1994, 1997   x 2, T11-L3   TONSILLECTOMY     UPPER GASTROINTESTINAL ENDOSCOPY      Social History   Socioeconomic History   Marital status: Married    Spouse name: Not on file   Number of children: 1   Years of education: Not on file   Highest education level: Not on file  Occupational History   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[employer]  Tobacco Use   Smoking status: Never   Smokeless tobacco: Never  Substance and Sexual Activity    Alcohol use: No   Drug use: No   Sexual activity: Not on file  Other Topics Concern   Not on file  Social History Narrative   Not on file   Social Determinants of Health   Financial Resource Strain: Not on file  Food Insecurity: Not on file  Transportation Needs: Not on file  Physical Activity: Not on file  Stress: Not on file  Social Connections: Not on file  Intimate Partner Violence: Not on file    Family History  Problem Relation Age of Onset   Diabetes Mother    Diabetes Father    Heart attack Sister    Hypertension Sister    Stroke Sister    Breast cancer Maternal Grandmother    Lymphoma Paternal Grandmother    Colon cancer Neg Hx    Stomach cancer Neg Hx     ROS: no fevers or chills, productive cough, hemoptysis, dysphasia, odynophagia, melena, hematochezia, dysuria, hematuria, rash, seizure activity, orthopnea, PND, pedal edema, claudication. Remaining systems are negative.  Physical Exam: Well-developed well-nourished in no acute distress.  Skin is warm and dry.  HEENT is normal.  Neck is supple.  Chest is clear to auscultation with normal expansion.  Cardiovascular exam is regular rate and rhythm.  Abdominal  exam nontender or distended. No masses palpated. Extremities show no edema. neuro grossly intact  ECG- personally reviewed  A/P  1 palpitations-this is previously felt likely secondary to PACs or PVCs.  Echocardiogram shows normal LV function and previous TSH normal.  Symptoms are unchanged and she would prefer conservative measures.  Will consider addition of beta-blockade in the future if necessary.  2 hyperlipidemia-continue low-dose statin.  Note recent calcium score 0.  Kirk Ruths, MD

## 2022-07-09 ENCOUNTER — Encounter: Payer: Self-pay | Admitting: Cardiology

## 2022-07-09 ENCOUNTER — Ambulatory Visit: Payer: BC Managed Care – PPO | Attending: Cardiology | Admitting: Cardiology

## 2022-07-09 VITALS — BP 122/74 | HR 77 | Ht 66.0 in | Wt 122.0 lb

## 2022-07-09 DIAGNOSIS — R002 Palpitations: Secondary | ICD-10-CM | POA: Diagnosis not present

## 2022-07-09 MED ORDER — METOPROLOL SUCCINATE ER 25 MG PO TB24: 25.0000 mg | ORAL_TABLET | Freq: Every day | ORAL | 3 refills | Status: DC

## 2022-07-09 NOTE — Progress Notes (Signed)
HPI: Follow-up palpitations. Holter monitor May 2018 showed sinus rhythm with PACs and PVCs.  Echocardiogram May 2018 showed normal LV function. Calcium score June 2023 0.  Echocardiogram June 2023 showed normal LV function, mild mitral regurgitation.  Since last seen patient states that for the past 2 weeks her palpitations have worsened.  She describes these as a flip-flop.  They are not sustained.  She otherwise does not have dyspnea on exertion, orthopnea, PND, pedal edema, chest pain or syncope.  She exercises routinely and does not have exertional chest pain.  Current Outpatient Medications  Medication Sig Dispense Refill   ALPRAZolam (XANAX) 0.5 MG tablet TAKE 1 TABLET(0.5 MG) BY MOUTH AT BEDTIME 30 tablet 3   citalopram (CELEXA) 20 MG tablet TAKE 1 TABLET BY MOUTH DAILY 90 tablet 1   COLLAGEN PO Take by mouth.     diltiazem (CARDIZEM) 30 MG tablet Take 1 tablet (30 mg total) by mouth 4 (four) times daily as needed. 30 tablet 1   rosuvastatin (CRESTOR) 10 MG tablet Take 1 tablet (10 mg total) by mouth daily. 90 tablet 3   No current facility-administered medications for this visit.     Past Medical History:  Diagnosis Date   Anxiety    Anxiety and depression    C. difficile diarrhea    Depression    Esophageal reflux    Hyperlipidemia    PONV (postoperative nausea and vomiting)    PVC's (premature ventricular contractions)     Past Surgical History:  Procedure Laterality Date   AUGMENTATION MAMMAPLASTY Bilateral 2006   Saline, In front of the muscle    CESAREAN SECTION     COLONOSCOPY     COLONOSCOPY  05/16/2012   Procedure: COLONOSCOPY;  Surgeon: Gatha Mayer, MD;  Location: [place];  Service: Endoscopy;  Laterality: N/A;  fecal transplant   LUMBAR FUSION  1994, 1997   x 2, T11-L3   TONSILLECTOMY     UPPER GASTROINTESTINAL ENDOSCOPY      Social History   Socioeconomic History   Marital status: Married    Spouse name: Not on file   Number of children:  1   Years of education: Not on file   Highest education level: Not on file  Occupational History   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[employer]  Tobacco Use   Smoking status: Never   Smokeless tobacco: Never  Substance and Sexual Activity   Alcohol use: No   Drug use: No   Sexual activity: Not on file  Other Topics Concern   Not on file  Social History Narrative   Not on file   Social Determinants of Health   Financial Resource Strain: Not on file  Food Insecurity: Not on file  Transportation Needs: Not on file  Physical Activity: Not on file  Stress: Not on file  Social Connections: Not on file  Intimate Partner Violence: Not on file    Family History  Problem Relation Age of Onset   Diabetes Mother    Diabetes Father    Heart attack Sister    Hypertension Sister    Stroke Sister    Breast cancer Maternal Grandmother    Lymphoma Paternal Grandmother    Colon cancer Neg Hx    Stomach cancer Neg Hx     ROS: no fevers or chills, productive cough, hemoptysis, dysphasia, odynophagia, melena, hematochezia, dysuria, hematuria, rash, seizure activity, orthopnea, PND, pedal edema, claudication. Remaining systems are negative.  Physical  Exam: Well-developed well-nourished in no acute distress.  Skin is warm and dry.  HEENT is normal.  Neck is supple.  Chest is clear to auscultation with normal expansion.  Cardiovascular exam is regular rate and rhythm.  Abdominal exam nontender or distended. No masses palpated. Extremities show no edema. neuro grossly intact   A/P  1 palpitations-previously felt likely secondary to PACs or PVCs.  LV function is normal.  Her symptoms are worse compared to previous.  I will discontinue diltiazem.  Instead we will try Toprol 25 mg nightly.  We can advance if needed or as tolerated.  2 hyperlipidemia-continue statin.  Note previous calcium score 0.  Kirk Ruths, MD

## 2022-07-09 NOTE — Patient Instructions (Signed)
Medication Instructions:   STOP DILTIAZEM  START METOPROLOL SUCC ER 25 MG ONCE DAILY AT BEDTIME  *If you need a refill on your cardiac medications before your next appointment, please call your pharmacy*   Follow-Up: At [facility], you and your health needs are our priority.  As part of our continuing mission to provide you with exceptional heart care, we have created designated Provider Care Teams.  These Care Teams include your primary Cardiologist (physician) and Advanced Practice Providers (APPs -  Physician Assistants and Nurse Practitioners) who all work together to provide you with the care you need, when you need it.  We recommend signing up for the patient portal called "MyChart".  Sign up information is provided on this After Visit Summary.  MyChart is used to connect with patients for Virtual Visits (Telemedicine).  Patients are able to view lab/test results, encounter notes, upcoming appointments, etc.  Non-urgent messages can be sent to your provider as well.   To learn more about what you can do with MyChart, go to [email or web address].    Your next appointment:   6 month(s)  Provider:   Kirk Ruths MD

## 2022-07-10 ENCOUNTER — Other Ambulatory Visit: Payer: Self-pay | Admitting: Family Medicine

## 2022-07-10 NOTE — Telephone Encounter (Signed)
Unable to refill per protocol, Rx expired. Discontinued 07/09/22.  Requested Prescriptions  Pending Prescriptions Disp Refills   diltiazem (CARDIZEM) 30 MG tablet [Pharmacy Med Name: DILTIAZEM '30MG'$  TABLETS] 30 tablet 1    Sig: TAKE 1 TABLET(30 MG) BY MOUTH FOUR TIMES DAILY AS NEEDED     Cardiovascular: Calcium Channel Blockers 3 Failed - 07/10/2022  3:08 AM      Failed - Valid encounter within last 6 months    Recent Outpatient Visits           9 months ago Palpitations   [place] Pickard, Cammie Mcgee, MD   1 year ago Suprapubic pressure   [place] Susy Frizzle, MD   1 year ago Suprapubic pressure   [place] Eulogio Bear, NP   1 year ago GAD (generalized anxiety disorder)   [place] Pickard, Cammie Mcgee, MD   1 year ago General medical exam   [place] Susy Frizzle, MD       Future Appointments             In 6 months Crenshaw, Denice Bors, MD [employer] [facility] - ALT in normal range and within 360 days    ALT  Date Value Ref Range Status  07/03/2022 18 6 - 29 U/L Final         Passed - AST in normal range and within 360 days    AST  Date Value Ref Range Status  07/03/2022 25 10 - 35 U/L Final         Passed - Cr in normal range and within 360 days    Creat  Date Value Ref Range Status  07/03/2022 0.81 0.50 - 1.03 mg/dL Final   Creatinine,U  Date Value Ref Range Status  09/08/2008  mg/dL Final   26.5 (NOTE)  Cutoff Values for Urine Drug Screen:        Drug Class           Cutoff (ng/mL)        Amphetamines            1000        Barbiturates             200        Cocaine Metabolites      300        Benzodiazepines          200        Methadone                 300        Opiates                 2000        Phencyclidine             25        Propoxyphene             300        Marijuana Metabolites     50  For medical  purposes only.         Passed - Last BP in normal range    BP Readings from Last 1 Encounters:  07/09/22 122/74         Passed - Last Heart Rate in normal range    Pulse  Readings from Last 1 Encounters:  07/09/22 77

## 2022-07-16 ENCOUNTER — Ambulatory Visit: Payer: BC Managed Care – PPO | Admitting: Cardiology

## 2022-08-12 DIAGNOSIS — B3731 Acute candidiasis of vulva and vagina: Secondary | ICD-10-CM | POA: Diagnosis not present

## 2022-08-17 ENCOUNTER — Encounter: Payer: Self-pay | Admitting: Cardiology

## 2022-08-17 DIAGNOSIS — R002 Palpitations: Secondary | ICD-10-CM

## 2022-08-18 ENCOUNTER — Ambulatory Visit (HOSPITAL_COMMUNITY): Payer: BC Managed Care – PPO | Attending: Internal Medicine

## 2022-08-18 DIAGNOSIS — I361 Nonrheumatic tricuspid (valve) insufficiency: Secondary | ICD-10-CM

## 2022-08-18 DIAGNOSIS — R002 Palpitations: Secondary | ICD-10-CM | POA: Diagnosis not present

## 2022-08-19 LAB — ECHOCARDIOGRAM COMPLETE
Area-P 1/2: 3.5 cm2
S' Lateral: 2.5 cm

## 2022-09-08 ENCOUNTER — Other Ambulatory Visit: Payer: Self-pay | Admitting: Family Medicine

## 2022-10-26 ENCOUNTER — Other Ambulatory Visit: Payer: Self-pay | Admitting: Obstetrics and Gynecology

## 2022-10-26 DIAGNOSIS — Z1231 Encounter for screening mammogram for malignant neoplasm of breast: Secondary | ICD-10-CM

## 2022-11-23 DIAGNOSIS — L570 Actinic keratosis: Secondary | ICD-10-CM | POA: Diagnosis not present

## 2022-11-23 DIAGNOSIS — L218 Other seborrheic dermatitis: Secondary | ICD-10-CM | POA: Diagnosis not present

## 2022-11-23 DIAGNOSIS — Z85828 Personal history of other malignant neoplasm of skin: Secondary | ICD-10-CM | POA: Diagnosis not present

## 2022-11-23 DIAGNOSIS — C44519 Basal cell carcinoma of skin of other part of trunk: Secondary | ICD-10-CM | POA: Diagnosis not present

## 2022-11-23 DIAGNOSIS — L821 Other seborrheic keratosis: Secondary | ICD-10-CM | POA: Diagnosis not present

## 2022-12-14 ENCOUNTER — Ambulatory Visit: Payer: BC Managed Care – PPO

## 2022-12-21 ENCOUNTER — Ambulatory Visit
Admission: RE | Admit: 2022-12-21 | Discharge: 2022-12-21 | Disposition: A | Discharge status: Home / Self Care | Payer: BC Managed Care – PPO | Source: Ambulatory Visit | Attending: Obstetrics and Gynecology | Admitting: Obstetrics and Gynecology

## 2022-12-21 DIAGNOSIS — Z1231 Encounter for screening mammogram for malignant neoplasm of breast: Secondary | ICD-10-CM | POA: Diagnosis not present

## 2022-12-22 ENCOUNTER — Other Ambulatory Visit: Payer: Self-pay | Admitting: Family Medicine

## 2022-12-23 NOTE — Telephone Encounter (Signed)
Requested medication (s) are due for refill today: Yes  Requested medication (s) are on the active medication list: Yes  Last refill:  06/08/22  Future visit scheduled: No  Notes to clinic:  Not delegated.    Requested Prescriptions  Pending Prescriptions Disp Refills   ALPRAZolam (XANAX) 0.5 MG tablet [Pharmacy Med Name: ALPRAZOLAM 0.5MG  TABLETS] 30 tablet     Sig: TAKE 1 TABLET(0.5 MG) BY MOUTH AT BEDTIME     Not Delegated - Psychiatry: Anxiolytics/Hypnotics 2 Failed - 12/22/2022 11:26 AM      Failed - This refill cannot be delegated      Failed - Urine Drug Screen completed in last 360 days      Failed - Valid encounter within last 6 months    Recent Outpatient Visits           1 year ago Palpitations   [facility] Tanya Nones, Priscille Heidelberg, MD   1 year ago Suprapubic pressure   [facility] Tanya Nones, Priscille Heidelberg, MD   1 year ago Suprapubic pressure   [facility] Valentino Nose, NP   2 years ago GAD (generalized anxiety disorder)   [facility] Pickard, Priscille Heidelberg, MD   2 years ago General medical exam   [facility] Pickard, Priscille Heidelberg, MD       Future Appointments             In 2 weeks Crenshaw, Madolyn Frieze, MD [facility] - Patient is not pregnant

## 2022-12-30 NOTE — Progress Notes (Deleted)
HPI: Follow-up palpitations. Holter monitor May 2018 showed sinus rhythm with PACs and PVCs. Calcium score June 2023 0.  Echocardiogram June 2023 showed normal LV function, mild mitral regurgitation.  Echocardiogram April 2024 showed normal LV function, borderline mitral valve prolapse with mild mitral regurgitation.  Since last seen   Current Outpatient Medications  Medication Sig Dispense Refill   ALPRAZolam (XANAX) 0.5 MG tablet TAKE 1 TABLET(0.5 MG) BY MOUTH AT BEDTIME 30 tablet 2   citalopram (CELEXA) 20 MG tablet TAKE 1 TABLET BY MOUTH DAILY 90 tablet 1   COLLAGEN PO Take by mouth.     metoprolol succinate (TOPROL XL) 25 MG 24 hr tablet Take 1 tablet (25 mg total) by mouth at bedtime. 90 tablet 3   rosuvastatin (CRESTOR) 10 MG tablet TAKE 1 TABLET(10 MG) BY MOUTH DAILY 90 tablet 3   No current facility-administered medications for this visit.     Past Medical History:  Diagnosis Date   Anxiety    Anxiety and depression    C. difficile diarrhea    Depression    Esophageal reflux    Hyperlipidemia    PONV (postoperative nausea and vomiting)    PVC's (premature ventricular contractions)     Past Surgical History:  Procedure Laterality Date   AUGMENTATION MAMMAPLASTY Bilateral 2006   Saline, In front of the muscle    CESAREAN SECTION     COLONOSCOPY     COLONOSCOPY  05/16/2012   Procedure: COLONOSCOPY;  Surgeon: Iva Boop, MD;  Location: [facility] ENDOSCOPY;  Service: Endoscopy;  Laterality: N/A;  fecal transplant   LUMBAR FUSION  1994, 1997   x 2, T11-L3   TONSILLECTOMY     UPPER GASTROINTESTINAL ENDOSCOPY      Social History   Socioeconomic History   Marital status: Married    Spouse name: Not on file   Number of children: 1   Years of education: Not on file   Highest education level: Not on file  Occupational History   Occupation: Event organiser: [employer]  Tobacco Use   Smoking status: Never   Smokeless tobacco: Never  Substance and  Sexual Activity   Alcohol use: No   Drug use: No   Sexual activity: Not on file  Other Topics Concern   Not on file  Social History Narrative   Not on file   Social Determinants of Health   Financial Resource Strain: Not on file  Food Insecurity: Not on file  Transportation Needs: Not on file  Physical Activity: Not on file  Stress: Not on file  Social Connections: Not on file  Intimate Partner Violence: Not on file    Family History  Problem Relation Age of Onset   Diabetes Mother    Diabetes Father    Heart attack Sister    Hypertension Sister    Stroke Sister    Breast cancer Maternal Grandmother    Lymphoma Paternal Grandmother    Colon cancer Neg Hx    Stomach cancer Neg Hx     ROS: no fevers or chills, productive cough, hemoptysis, dysphasia, odynophagia, melena, hematochezia, dysuria, hematuria, rash, seizure activity, orthopnea, PND, pedal edema, claudication. Remaining systems are negative.  Physical Exam: Well-developed well-nourished in no acute distress.  Skin is warm and dry.  HEENT is normal.  Neck is supple.  Chest is clear to auscultation with normal expansion.  Cardiovascular exam is regular rate and rhythm.  Abdominal exam nontender or distended. No  masses palpated. Extremities show no edema. neuro grossly intact  ECG- personally reviewed  A/P  1 palpitations-this is felt secondary to PACs and PVCs.  LV function is normal.  Will continue Toprol.  Her symptoms have improved compared to previous office visit.  2 hyperlipidemia-continue statin.  Olga Millers, MD

## 2023-01-08 ENCOUNTER — Ambulatory Visit: Payer: BC Managed Care – PPO | Admitting: Cardiology

## 2023-02-16 ENCOUNTER — Other Ambulatory Visit: Payer: Self-pay | Admitting: Family Medicine

## 2023-02-16 NOTE — Telephone Encounter (Signed)
Requested medications are due for refill today.  yes  Requested medications are on the active medications list.  yes  Last refill. 02/11/2022 #90 1 rf  Future visit scheduled.   no  Notes to clinic.  Pt last seen 07/03/2022. Pt is due for a CPE.    Requested Prescriptions  Pending Prescriptions Disp Refills   citalopram (CELEXA) 20 MG tablet [Pharmacy Med Name: CITALOPRAM 20MG  TABLETS] 90 tablet 1    Sig: TAKE 1 TABLET BY MOUTH DAILY     Psychiatry:  Antidepressants - SSRI Failed - 02/16/2023 10:51 AM      Failed - Valid encounter within last 6 months    Recent Outpatient Visits           1 year ago Palpitations   [facility] Pickard, Priscille Heidelberg, MD   2 years ago Suprapubic pressure   [facility] Tanya Nones, Priscille Heidelberg, MD   2 years ago Suprapubic pressure   [facility] Valentino Nose, NP   2 years ago GAD (generalized anxiety disorder)   [facility] Pickard, Priscille Heidelberg, MD   2 years ago General medical exam   [facility] Donita Brooks, MD              Passed - Completed PHQ-2 or PHQ-9 in the last 360 days

## 2023-02-23 DIAGNOSIS — M25561 Pain in right knee: Secondary | ICD-10-CM | POA: Diagnosis not present

## 2023-03-15 DIAGNOSIS — Z1331 Encounter for screening for depression: Secondary | ICD-10-CM | POA: Diagnosis not present

## 2023-03-15 DIAGNOSIS — Z01419 Encounter for gynecological examination (general) (routine) without abnormal findings: Secondary | ICD-10-CM | POA: Diagnosis not present

## 2023-03-15 DIAGNOSIS — Z124 Encounter for screening for malignant neoplasm of cervix: Secondary | ICD-10-CM | POA: Diagnosis not present

## 2023-03-16 ENCOUNTER — Other Ambulatory Visit: Payer: Self-pay | Admitting: Obstetrics and Gynecology

## 2023-03-16 DIAGNOSIS — E2839 Other primary ovarian failure: Secondary | ICD-10-CM

## 2023-03-25 DIAGNOSIS — E2839 Other primary ovarian failure: Secondary | ICD-10-CM | POA: Diagnosis not present

## 2023-03-25 DIAGNOSIS — M8589 Other specified disorders of bone density and structure, multiple sites: Secondary | ICD-10-CM | POA: Diagnosis not present

## 2023-03-25 DIAGNOSIS — M85852 Other specified disorders of bone density and structure, left thigh: Secondary | ICD-10-CM | POA: Diagnosis not present

## 2023-03-29 ENCOUNTER — Encounter: Payer: Self-pay | Admitting: Family Medicine

## 2023-03-31 DIAGNOSIS — B3731 Acute candidiasis of vulva and vagina: Secondary | ICD-10-CM | POA: Diagnosis not present

## 2023-03-31 DIAGNOSIS — N390 Urinary tract infection, site not specified: Secondary | ICD-10-CM | POA: Diagnosis not present

## 2023-04-06 DIAGNOSIS — B3731 Acute candidiasis of vulva and vagina: Secondary | ICD-10-CM | POA: Diagnosis not present

## 2023-04-06 DIAGNOSIS — R300 Dysuria: Secondary | ICD-10-CM | POA: Diagnosis not present

## 2023-04-13 DIAGNOSIS — R102 Pelvic and perineal pain: Secondary | ICD-10-CM | POA: Diagnosis not present

## 2023-04-13 DIAGNOSIS — R85615 Unsatisfactory cytologic smear of anus: Secondary | ICD-10-CM | POA: Diagnosis not present

## 2023-04-16 ENCOUNTER — Other Ambulatory Visit: Payer: Self-pay

## 2023-04-16 ENCOUNTER — Encounter (HOSPITAL_BASED_OUTPATIENT_CLINIC_OR_DEPARTMENT_OTHER): Payer: Self-pay | Admitting: Emergency Medicine

## 2023-04-16 ENCOUNTER — Emergency Department (HOSPITAL_BASED_OUTPATIENT_CLINIC_OR_DEPARTMENT_OTHER)
Admission: EM | Admit: 2023-04-16 | Discharge: 2023-04-16 | Disposition: A | Discharge status: Home / Self Care | Payer: BC Managed Care – PPO | Attending: Emergency Medicine | Admitting: Emergency Medicine

## 2023-04-16 DIAGNOSIS — R350 Frequency of micturition: Secondary | ICD-10-CM | POA: Diagnosis not present

## 2023-04-16 DIAGNOSIS — N760 Acute vaginitis: Secondary | ICD-10-CM | POA: Insufficient documentation

## 2023-04-16 DIAGNOSIS — B9689 Other specified bacterial agents as the cause of diseases classified elsewhere: Secondary | ICD-10-CM | POA: Diagnosis not present

## 2023-04-16 LAB — URINALYSIS, ROUTINE W REFLEX MICROSCOPIC
Bilirubin Urine: NEGATIVE
Glucose, UA: NEGATIVE mg/dL
Hgb urine dipstick: NEGATIVE
Ketones, ur: NEGATIVE mg/dL
Leukocytes,Ua: NEGATIVE
Nitrite: NEGATIVE
Protein, ur: NEGATIVE mg/dL
Specific Gravity, Urine: <1.005 — ABNORMAL LOW (ref 1.005–1.030)
pH: 6 (ref 5.0–8.0)

## 2023-04-16 LAB — WET PREP, GENITAL
Sperm: NONE SEEN
Trich, Wet Prep: NONE SEEN
WBC, Wet Prep HPF POC: >=10 — AB (ref <10)
Yeast Wet Prep HPF POC: NONE SEEN

## 2023-04-16 MED ORDER — METRONIDAZOLE 1 % EX GEL: Freq: Every day | CUTANEOUS | 0 refills | Status: DC

## 2023-04-16 NOTE — Discharge Instructions (Addendum)
Also showed no evidence of urinary tract infection.  Your wet prep showed no evidence of yeast or trichomonas, did show evidence of bacterial vaginosis.  Will treat this with Flagyl gel to be applied daily until symptoms resolve.  Please follow-up with an OB/GYN for continued outpatient management.

## 2023-04-16 NOTE — ED Triage Notes (Signed)
Pt caox4, ambulatory, NAD c/o "feeling constant pressure like I need to urinate" with increased urinary frequency. Pt denies pain, N/V/D, . Reports 3 weeks ago she was tx for yeast infection, pt further reports she was called and told she was told she had urine culture + streptococcus mitis/oralis.

## 2023-04-16 NOTE — ED Provider Notes (Signed)
[place] EMERGENCY DEPARTMENT AT [facility] Provider Note   CSN: [id number] Arrival date & time: 04/16/23  1823     History  Chief Complaint  Patient presents with   Urinary Frequency    [name] is a 52 y.o. female.   Urinary Frequency     52 year old female with medical history significant for C. difficile diarrhea, GERD, anxiety, depression, presenting to the emergency department with pressure sensation suprapubically.  The patient states that she is constantly feeling an urge to urinate.  She has had some increased urinary frequency.  She denies any dysuria.  She was seen 3 weeks ago by an OB/GYN and was treated for a yeast infection with 2 doses of Diflucan.  She still is thinking that she is having some symptoms.  She also found to have a urine culture that was positive for Streptococcus mitis/oralis however this was only 30-50,000 colony forming units.  She denies any fevers or chills.  Has not had vaginal bleeding or abnormal vaginal discharge or abnormal odor.  She does continue to endorses sensation of pressure.  No concern for STIs.  Home Medications Prior to Admission medications   Medication Sig Start Date End Date Taking? Authorizing Provider  metroNIDAZOLE (METROGEL) 1 % gel Apply topically daily. 04/16/23  Yes Ernie Avena, MD  ALPRAZolam Prudy Feeler) 0.5 MG tablet TAKE 1 TABLET(0.5 MG) BY MOUTH AT BEDTIME 12/28/22   Donita Brooks, MD  citalopram (CELEXA) 20 MG tablet TAKE 1 TABLET BY MOUTH DAILY 02/11/22   Donita Brooks, MD  COLLAGEN PO Take by mouth.    [provider]  metoprolol succinate (TOPROL XL) 25 MG 24 hr tablet Take 1 tablet (25 mg total) by mouth at bedtime. 07/09/22   Lewayne Bunting, MD  rosuvastatin (CRESTOR) 10 MG tablet TAKE 1 TABLET(10 MG) BY MOUTH DAILY 09/08/22   Donita Brooks, MD      Allergies    Codeine, Hydrocodone-acetaminophen, Morphine, and Nitrofurantoin    Review of Systems   Review of Systems   Genitourinary:  Positive for frequency.  All other systems reviewed and are negative.   Physical Exam Updated Vital Signs BP (!) 140/70 (BP Location: Right Arm)   Pulse 69   Temp 98 F (36.7 C) (Oral)   Resp 15   Ht 5\' 6"  (1.676 m)   Wt 54.4 kg   LMP 04/06/2018 (Approximate)   SpO2 100%   BMI 19.37 kg/m  Physical Exam Vitals and nursing note reviewed. Exam conducted with a chaperone present.  Constitutional:      General: She is not in acute distress. HENT:     Head: Normocephalic and atraumatic.  Eyes:     Conjunctiva/sclera: Conjunctivae normal.     Pupils: Pupils are equal, round, and reactive to light.  Cardiovascular:     Rate and Rhythm: Normal rate and regular rhythm.  Pulmonary:     Effort: Pulmonary effort is normal. No respiratory distress.  Abdominal:     General: There is no distension.     Tenderness: There is no guarding.  Genitourinary:    Comments: External inspection revealed no evidence of vulvovaginal candidiasis, no evidence of pelvic organ prolapse Musculoskeletal:        General: No deformity or signs of injury.     Cervical back: Neck supple.  Skin:    Findings: No lesion or rash.  Neurological:     General: No focal deficit present.     Mental Status: She is  alert. Mental status is at baseline.     ED Results / Procedures / Treatments   Labs (all labs ordered are listed, but only abnormal results are displayed) Labs Reviewed  WET PREP, GENITAL - Abnormal; Notable for the following components:      Result Value   Clue Cells Wet Prep HPF POC PRESENT (*)    WBC, Wet Prep HPF POC >=10 (*)    All other components within normal limits  URINALYSIS, ROUTINE W REFLEX MICROSCOPIC - Abnormal; Notable for the following components:   Color, Urine COLORLESS (*)    Specific Gravity, Urine <1.005 (*)    All other components within normal limits  URINE CULTURE    EKG None  Radiology No results found.  Procedures Procedures     Medications Ordered in ED Medications - No data to display  ED Course/ Medical Decision Making/ A&P Clinical Course as of 04/16/23 2127  Fri Apr 16, 2023  2100 Clue Cells Wet Prep HPF POC(!): PRESENT [JL]    Clinical Course User Index [JL] Ernie Avena, MD                                 Medical Decision Making Amount and/or Complexity of Data Reviewed Labs: ordered. Decision-making details documented in ED Course.  Risk Prescription drug management.     52 year old female with medical history significant for C. difficile diarrhea, GERD, anxiety, depression, presenting to the emergency department with pressure sensation suprapubically.  The patient states that she is constantly feeling an urge to urinate.  She has had some increased urinary frequency.  She denies any dysuria.  She was seen 3 weeks ago by an OB/GYN and was treated for a yeast infection with 2 doses of Diflucan.  She still is thinking that she is having some symptoms.  She also found to have a urine culture that was positive for Streptococcus mitis/oralis however this was only 30-50,000 colony forming units.  She denies any fevers or chills.  Has not had vaginal bleeding or abnormal vaginal discharge or abnormal odor.  She does continue to endorses sensation of pressure.  No concern for STIs.  On arrival, the patient was vitally stable.  Presenting with sensation of pelvic pressure and a sensation of needing to void frequently.  Differential includes urinary tract infection, yeast infection, bacterial vaginosis, urinary retention.  Analysis performed which revealed no evidence of hematuria, negative for UTI with negative nitrites, negative leukocytes.  Urine culture was collected and sent.  A wet prep was performed which was negative for yeast and trichomonas, positive for clue cells and greater than 10 WBCs.  In the setting of this, will treat with Flagyl gel.  Physical exam revealed no evidence of vulvovaginal  candidiasis.  A postvoid residual was 0ml.  The patient has been appropriately medically screened and/or stabilized in the ED. I have low suspicion for any other emergent medical condition which would require further screening, evaluation or treatment in the ED or require inpatient management.   At this time with reassuring workup, will treat for bacterial vaginosis, advised that she follow-up outpatient with an OB/GYN for continued outpatient management.   Final Clinical Impression(s) / ED Diagnoses Final diagnoses:  Urinary frequency  Bacterial vaginosis    Rx / DC Orders ED Discharge Orders          Ordered    metroNIDAZOLE (METROGEL) 1 % gel  Daily  04/16/23 2110              Ernie Avena, MD 04/16/23 2127

## 2023-04-17 ENCOUNTER — Telehealth (HOSPITAL_BASED_OUTPATIENT_CLINIC_OR_DEPARTMENT_OTHER): Payer: Self-pay | Admitting: Emergency Medicine

## 2023-04-17 MED ORDER — METRONIDAZOLE 0.75 % VA GEL
1.0000 | Freq: Every day | VAGINAL | 0 refills | Status: AC
Start: 2023-04-17 — End: 2023-04-22

## 2023-04-17 MED ORDER — METRONIDAZOLE 500 MG PO TABS: 500.0000 mg | ORAL_TABLET | Freq: Two times a day (BID) | ORAL | 0 refills | Status: DC

## 2023-04-17 NOTE — Telephone Encounter (Signed)
Pt was sent topical flagyl, she is requesting po flagyl instead for bv. Sent to pharmacy

## 2023-04-17 NOTE — Telephone Encounter (Signed)
Pt now requesting topical flagyl, will dc po and send vaginal version.

## 2023-04-18 LAB — URINE CULTURE: Culture: NO GROWTH

## 2023-04-19 DIAGNOSIS — R1084 Generalized abdominal pain: Secondary | ICD-10-CM | POA: Diagnosis not present

## 2023-04-19 DIAGNOSIS — N760 Acute vaginitis: Secondary | ICD-10-CM | POA: Diagnosis not present

## 2023-04-23 ENCOUNTER — Ambulatory Visit: Payer: Self-pay | Admitting: Family Medicine

## 2023-04-23 ENCOUNTER — Other Ambulatory Visit: Payer: Self-pay | Admitting: Family Medicine

## 2023-04-23 MED ORDER — SULFAMETHOXAZOLE-TRIMETHOPRIM 800-160 MG PO TABS: 1.0000 | ORAL_TABLET | Freq: Two times a day (BID) | ORAL | 0 refills | Status: DC

## 2023-04-23 NOTE — Telephone Encounter (Signed)
Chief Complaint: medication problem Symptoms: chills, body aches, headache Pertinent Negatives: Patient denies rash, itching, shortness of breath Disposition: [] ED /[x] Urgent Care (no appt availability in office) / [] Appointment(In office/virtual)/ []  [place] [facility]/ [] Home Care/ [x] Refused Recommended Disposition /[] [place] [facility]/ []  Follow-up with PCP Additional Notes: Patient reports she was prescribed Macrobid by her OBGYN for a UTI yesterday. Patient reports she has a history of cdiff so she is hesitant and limited on the antibiotics she can take. Patient reports when she took the Macrobid last night she felt body aches, chills, and a headache. Patient reports this lasted for approx 1 hour after she took medication and that she is no longer having these symptoms. Patient reports she has had this reaction to Macrobid prior to her cdiff complications. Patient denies rash, itching, swelling, or other signs of anaphylaxis. Patient requesting appt in office today with Dr. Tanya Nones specifically to prescribe new UTI medication because he is aware of her history. This RN advised no appt available in office at this time and recommended UC visit or contacting OBGYN who prescribed medication. Patient reports she would like Dr. Tanya Nones to call her in new medication without being seen. This RN advised she would forward information to office and advised patient to discontinue taking Macrobid until speaking with a provider (OBGYN,UC, or Dr. Tanya Nones) to prevent further reaction. Patient advised to call back if symptoms worsen or come back. Patient verbalized understanding.      Copied from CRM [id number]. Topic: Clinical - Red Word Triage >> Apr 23, 2023  8:15 AM [name] wrote: Red Word that prompted transfer to Nurse Triage: Allergic reaction to a medication called Macrobid. Bodyaches, chills, fever are her symptoms that started yesterday. Reason for Disposition  Prescription request for new  medicine (not a refill)  Answer Assessment - Initial Assessment Questions 1. ONSET: "When did the muscle aches or body pains start?"      yesterday 2. LOCATION: "What part of your body is hurting?" (e.g., entire body, arms, legs)      Full body 3. SEVERITY: "How bad is the pain?" (Scale 1-10; or mild, moderate, severe)   - MILD (1-3): doesn't interfere with normal activities    - MODERATE (4-7): interferes with normal activities or awakens from sleep    - SEVERE (8-10):  excruciating pain, unable to do any normal activities      Moderate  4. CAUSE: "What do you think is causing the pains?"     I started taking a medication and I think that's why 5. FEVER: "Have you been having fever?"     no 6. OTHER SYMPTOMS: "Do you have any other symptoms?" (e.g., chest pain, weakness, rash, cold or flu symptoms, weight loss)     Chills, body aches, headache  Answer Assessment - Initial Assessment Questions 1. NAME of MEDICINE: "What medicine(s) are you calling about?"     macrobid 2. QUESTION: "What is your question?" (e.g., double dose of medicine, side effect)     Patient would like different medication prescribed because she feels her body is not reacting well to medication 3. PRESCRIBER: "Who prescribed the medicine?" Reason: if prescribed by specialist, call should be referred to that group.     OBGYN 4. SYMPTOMS: "Do you have any symptoms?" If Yes, ask: "What symptoms are you having?"  "How bad are the symptoms (e.g., mild, moderate, severe)     Chills, body aches, headache  Protocols used: Muscle Aches and Body Pain-A-AH, Medication Question Call-A-AH

## 2023-04-26 ENCOUNTER — Encounter: Payer: Self-pay | Admitting: Family Medicine

## 2023-04-26 ENCOUNTER — Ambulatory Visit: Payer: BC Managed Care – PPO | Admitting: Family Medicine

## 2023-04-26 VITALS — BP 116/62 | HR 75 | Temp 98.1°F | Ht 66.0 in | Wt 123.2 lb

## 2023-04-26 DIAGNOSIS — Z1211 Encounter for screening for malignant neoplasm of colon: Secondary | ICD-10-CM

## 2023-04-26 DIAGNOSIS — R002 Palpitations: Secondary | ICD-10-CM | POA: Diagnosis not present

## 2023-04-26 DIAGNOSIS — I493 Ventricular premature depolarization: Secondary | ICD-10-CM | POA: Diagnosis not present

## 2023-04-26 DIAGNOSIS — H8110 Benign paroxysmal vertigo, unspecified ear: Secondary | ICD-10-CM | POA: Diagnosis not present

## 2023-04-26 DIAGNOSIS — R300 Dysuria: Secondary | ICD-10-CM | POA: Diagnosis not present

## 2023-04-26 DIAGNOSIS — R5383 Other fatigue: Secondary | ICD-10-CM | POA: Diagnosis not present

## 2023-04-26 LAB — URINALYSIS, ROUTINE W REFLEX MICROSCOPIC
Bacteria, UA: NONE SEEN /[HPF]
Bilirubin Urine: NEGATIVE
Casts: NONE SEEN /[LPF]
Glucose, UA: NEGATIVE
Ketones, ur: NEGATIVE
Leukocytes,Ua: NEGATIVE
Nitrite: NEGATIVE
Protein, ur: NEGATIVE
Specific Gravity, Urine: 1.024 (ref 1.001–1.035)
WBC, UA: NONE SEEN /[HPF] (ref 0–5)
Yeast: NONE SEEN /[HPF]
pH: 5 (ref 5.0–8.0)

## 2023-04-26 LAB — MICROSCOPIC MESSAGE

## 2023-04-26 NOTE — Progress Notes (Signed)
Subjective:    Patient ID: [name], female    DOB: [birth year], 52 y.o.   MRN: [id number] Patient reports a 1 month history of fatigue.  She denies any fevers or chills.  She was recently treated for urinary tract infection and repeat urinalysis is negative.  She denies any chest pain shortness of breath or dyspnea on exertion.  She denies any melena or hematochezia.  She denies any vaginal bleeding.  She does report increasing weight.  Mammogram and Pap smear are up-to-date.  Recently had an ultrasound of the ovaries that was negative.  Past Medical History:  Diagnosis Date   Anxiety    Anxiety and depression    C. difficile diarrhea    Depression    Esophageal reflux    Hyperlipidemia    PONV (postoperative nausea and vomiting)    PVC's (premature ventricular contractions)    Past Surgical History:  Procedure Laterality Date   AUGMENTATION MAMMAPLASTY Bilateral 2006   Saline, In front of the muscle    CESAREAN SECTION     COLONOSCOPY     COLONOSCOPY  05/16/2012   Procedure: COLONOSCOPY;  Surgeon: Iva Boop, MD;  Location: [facility] ENDOSCOPY;  Service: Endoscopy;  Laterality: N/A;  fecal transplant   LUMBAR FUSION  1994, 1997   x 2, T11-L3   TONSILLECTOMY     UPPER GASTROINTESTINAL ENDOSCOPY     Current Outpatient Medications on File Prior to Visit  Medication Sig Dispense Refill   ALPRAZolam (XANAX) 0.5 MG tablet TAKE 1 TABLET(0.5 MG) BY MOUTH AT BEDTIME 30 tablet 2   citalopram (CELEXA) 20 MG tablet TAKE 1 TABLET BY MOUTH DAILY 90 tablet 1   COLLAGEN PO Take by mouth.     metoprolol succinate (TOPROL XL) 25 MG 24 hr tablet Take 1 tablet (25 mg total) by mouth at bedtime. 90 tablet 3   rosuvastatin (CRESTOR) 10 MG tablet TAKE 1 TABLET(10 MG) BY MOUTH DAILY 90 tablet 3   sulfamethoxazole-trimethoprim (BACTRIM DS) 800-160 MG tablet Take 1 tablet by mouth 2 (two) times daily. 14 tablet 0   No current facility-administered medications on file prior to visit.   Allergies   Allergen Reactions   Codeine Nausea And Vomiting   Hydrocodone-Acetaminophen     REACTION: Palpitations   Morphine     REACTION: Nausea   Nitrofurantoin     REACTION: flu-like symtoms   Social History   Socioeconomic History   Marital status: Married    Spouse name: Not on file   Number of children: 1   Years of education: Not on file   Highest education level: Not on file  Occupational History   Occupation: Event organiser: [employer]  Tobacco Use   Smoking status: Never   Smokeless tobacco: Never  Substance and Sexual Activity   Alcohol use: No   Drug use: No   Sexual activity: Not on file  Other Topics Concern   Not on file  Social History Narrative   Not on file   Social Drivers of Health   Financial Resource Strain: Not on file  Food Insecurity: Not on file  Transportation Needs: Not on file  Physical Activity: Not on file  Stress: Not on file  Social Connections: Not on file  Intimate Partner Violence: Not on file     Review of Systems  Cardiovascular:  Positive for palpitations.  All other systems reviewed and are negative.      Objective:   Physical  Exam Vitals reviewed.  Constitutional:      Appearance: Normal appearance. She is normal weight.  Cardiovascular:     Rate and Rhythm: Normal rate and regular rhythm.  Pulmonary:     Effort: Pulmonary effort is normal.     Breath sounds: Normal breath sounds.  Neurological:     General: No focal deficit present.     Mental Status: She is alert and oriented to person, place, and time.  Psychiatric:        Mood and Affect: Mood normal.        Behavior: Behavior normal.        Thought Content: Thought content normal.        Judgment: Judgment normal.           Assessment & Plan:  Dysuria - Plan: Urinalysis, Routine w reflex microscopic, Urine Culture  Fatigue, unspecified type - Plan: Vitamin B12, CBC with Differential/Platelet, COMPLETE METABOLIC PANEL WITH GFR,  TSH  Colon cancer screening - Plan: Cologuard Given her fatigue, I will check a CBC a CMP TSH and a B12.  Urinalysis shows no evidence of urinary tract infection.  Up-to-date colon cancer screening with a Cologuard.

## 2023-04-27 LAB — COMPLETE METABOLIC PANEL WITH GFR
AG Ratio: 1.7 (calc) (ref 1.0–2.5)
ALT: 20 U/L (ref 6–29)
AST: 20 U/L (ref 10–35)
Albumin: 4.2 g/dL (ref 3.6–5.1)
Alkaline phosphatase (APISO): 57 U/L (ref 37–153)
BUN: 18 mg/dL (ref 7–25)
CO2: 27 mmol/L (ref 20–32)
Calcium: 9.2 mg/dL (ref 8.6–10.4)
Chloride: 103 mmol/L (ref 98–110)
Creat: 0.69 mg/dL (ref 0.50–1.03)
Globulin: 2.5 g/dL (ref 1.9–3.7)
Glucose, Bld: 131 mg/dL — ABNORMAL HIGH (ref 65–99)
Potassium: 4.2 mmol/L (ref 3.5–5.3)
Sodium: 141 mmol/L (ref 135–146)
Total Bilirubin: 0.3 mg/dL (ref 0.2–1.2)
Total Protein: 6.7 g/dL (ref 6.1–8.1)
eGFR: 104 mL/min/{1.73_m2} (ref >=60)

## 2023-04-27 LAB — CBC WITH DIFFERENTIAL/PLATELET
Absolute Lymphocytes: 1867 {cells}/uL (ref 850–3900)
Absolute Monocytes: 406 {cells}/uL (ref 200–950)
Basophils Absolute: 99 {cells}/uL (ref 0–200)
Basophils Relative: 1.9 %
Eosinophils Absolute: 250 {cells}/uL (ref 15–500)
Eosinophils Relative: 4.8 %
HCT: 38.5 % (ref 35.0–45.0)
Hemoglobin: 12.8 g/dL (ref 11.7–15.5)
MCH: 30.1 pg (ref 27.0–33.0)
MCHC: 33.2 g/dL (ref 32.0–36.0)
MCV: 90.6 fL (ref 80.0–100.0)
MPV: 10.9 fL (ref 7.5–12.5)
Monocytes Relative: 7.8 %
Neutro Abs: 2579 {cells}/uL (ref 1500–7800)
Neutrophils Relative %: 49.6 %
Platelets: 231 10*3/uL (ref 140–400)
RBC: 4.25 10*6/uL (ref 3.80–5.10)
RDW: 12 % (ref 11.0–15.0)
Total Lymphocyte: 35.9 %
WBC: 5.2 10*3/uL (ref 3.8–10.8)

## 2023-04-27 LAB — URINE CULTURE
MICRO NUMBER:: 15883394
Result:: NO GROWTH
SPECIMEN QUALITY:: ADEQUATE

## 2023-04-27 LAB — TSH: TSH: 1.77 m[IU]/L

## 2023-04-27 LAB — VITAMIN B12: Vitamin B-12: 592 pg/mL (ref 200–1100)

## 2023-04-30 DIAGNOSIS — N390 Urinary tract infection, site not specified: Secondary | ICD-10-CM | POA: Diagnosis not present

## 2023-05-06 ENCOUNTER — Encounter: Payer: Self-pay | Admitting: Family Medicine

## 2023-05-06 ENCOUNTER — Ambulatory Visit: Payer: BC Managed Care – PPO | Admitting: Family Medicine

## 2023-05-06 VITALS — BP 120/70 | HR 67 | Temp 98.2°F | Ht 66.0 in | Wt 122.0 lb

## 2023-05-06 DIAGNOSIS — R300 Dysuria: Secondary | ICD-10-CM | POA: Diagnosis not present

## 2023-05-06 DIAGNOSIS — N949 Unspecified condition associated with female genital organs and menstrual cycle: Secondary | ICD-10-CM | POA: Diagnosis not present

## 2023-05-06 LAB — URINALYSIS, ROUTINE W REFLEX MICROSCOPIC
Bacteria, UA: NONE SEEN /[HPF]
Bilirubin Urine: NEGATIVE
Glucose, UA: NEGATIVE
Hyaline Cast: NONE SEEN /[LPF]
Ketones, ur: NEGATIVE
Leukocytes,Ua: NEGATIVE
Nitrite: NEGATIVE
Protein, ur: NEGATIVE
RBC / HPF: NONE SEEN /[HPF] (ref 0–2)
Specific Gravity, Urine: 1.015 (ref 1.001–1.035)
WBC, UA: NONE SEEN /[HPF] (ref 0–5)
pH: 6 (ref 5.0–8.0)

## 2023-05-06 LAB — MICROSCOPIC MESSAGE

## 2023-05-06 NOTE — Addendum Note (Signed)
 Addended by: Park Meo on: 05/06/2023 04:39 PM   Modules accepted: Orders

## 2023-05-06 NOTE — Assessment & Plan Note (Addendum)
 No red flags on exam. UA negative, bacterial and WBCs present on wet prep. Discussed testing options with pt and she is OK with STI testing, will send urine for G/C and trich, could not rule out trichomonas on wet prep. Will trial AZO to rule out bladder spasms. Normal pelvic exam and OB US  was normal per patient, otherwise would recommend ultrasound of right ovary as this was palpable on exam. She will consider abdominal XR to assess for heavy stool burden. Return to office if symptoms persist or worsen.

## 2023-05-06 NOTE — Progress Notes (Signed)
 Subjective:  HPI: [name] is a 53 y.o. female presenting on 05/06/2023 for Urinary Tract Infection (Feel a lot pressure/feels like have to urinate, started 5 weeks ago and clear up w/med now its back. Per pt feels like her private is swollen.)   HPI Patient is in today for vaginal pressure and urinary urgency since Tuesday. She has had recent yeast infection, BV, and UTI. Has been taking vaginal proibotics, has seen her OB and had an ultrasound that was normal. She denies dysuria, frequency, vaginal discharge or bleeding, fever, chills, body aches, is having normal stool pattern and urinary pattern.   Review of Systems  All other systems reviewed and are negative.   Relevant past medical history reviewed and updated as indicated.   Past Medical History:  Diagnosis Date   Anxiety    Anxiety and depression    C. difficile diarrhea    Depression    Esophageal reflux    Hyperlipidemia    PONV (postoperative nausea and vomiting)    PVC's (premature ventricular contractions)      Past Surgical History:  Procedure Laterality Date   AUGMENTATION MAMMAPLASTY Bilateral 2006   Saline, In front of the muscle    CESAREAN SECTION     COLONOSCOPY     COLONOSCOPY  05/16/2012   Procedure: COLONOSCOPY;  Surgeon: Lupita FORBES Commander, MD;  Location: [facility] ENDOSCOPY;  Service: Endoscopy;  Laterality: N/A;  fecal transplant   LUMBAR FUSION  1994, 1997   x 2, T11-L3   TONSILLECTOMY     UPPER GASTROINTESTINAL ENDOSCOPY      Allergies and medications reviewed and updated.   Current Outpatient Medications:    citalopram  (CELEXA ) 20 MG tablet, TAKE 1 TABLET BY MOUTH DAILY (Patient taking differently: 5 mg. TAKE 1 TABLET BY MOUTH DAILY PER PT ONLY TAKING 5 MG), Disp: 90 tablet, Rfl: 1   rosuvastatin  (CRESTOR ) 10 MG tablet, TAKE 1 TABLET(10 MG) BY MOUTH DAILY, Disp: 90 tablet, Rfl: 3   ALPRAZolam  (XANAX ) 0.5 MG tablet, TAKE 1 TABLET(0.5 MG) BY MOUTH AT BEDTIME (Patient not taking: Reported on  05/06/2023), Disp: 30 tablet, Rfl: 2   COLLAGEN PO, Take by mouth. (Patient not taking: Reported on 05/06/2023), Disp: , Rfl:    metoprolol  succinate (TOPROL  XL) 25 MG 24 hr tablet, Take 1 tablet (25 mg total) by mouth at bedtime. (Patient not taking: Reported on 04/26/2023), Disp: 90 tablet, Rfl: 3   sulfamethoxazole -trimethoprim  (BACTRIM  DS) 800-160 MG tablet, Take 1 tablet by mouth 2 (two) times daily. (Patient not taking: Reported on 05/06/2023), Disp: 14 tablet, Rfl: 0  Allergies  Allergen Reactions   Codeine Nausea And Vomiting   Hydrocodone-Acetaminophen      REACTION: Palpitations   Morphine     REACTION: Nausea   Nitrofurantoin     REACTION: flu-like symtoms    Objective:   BP 120/70   Pulse 67   Temp 98.2 F (36.8 C) (Oral)   Ht 5' 6 (1.676 m)   Wt 122 lb (55.3 kg)   LMP 04/06/2018 (Approximate)   SpO2 98%   BMI 19.69 kg/m      05/06/2023    3:28 PM 04/26/2023    2:05 PM 04/16/2023    6:30 PM  Vitals with BMI  Height 5' 6 5' 6 5' 6  Weight 122 lbs 123 lbs 4 oz 120 lbs  BMI 19.7 19.9 19.38  Systolic 120 116   Diastolic 70 62   Pulse 67 75  Physical Exam Vitals and nursing note reviewed. Exam conducted with a chaperone present.  Constitutional:      Appearance: Normal appearance. She is normal weight.  HENT:     Head: Normocephalic and atraumatic.  Abdominal:     General: Abdomen is flat. Bowel sounds are normal.     Palpations: Abdomen is soft.     Tenderness: There is no abdominal tenderness.  Genitourinary:    General: Normal vulva.     Vagina: Normal.     Cervix: Normal.     Uterus: Normal.      Adnexa: Right adnexa normal.       Left: Fullness present.      Rectum: Normal.  Skin:    General: Skin is warm and dry.  Neurological:     General: No focal deficit present.     Mental Status: She is alert and oriented to person, place, and time. Mental status is at baseline.  Psychiatric:        Mood and Affect: Mood normal.        Behavior:  Behavior normal.        Thought Content: Thought content normal.        Judgment: Judgment normal.     Assessment & Plan:  Vaginal discomfort Assessment & Plan: No red flags on exam. UA negative, bacterial and WBCs present on wet prep. Discussed testing options with pt and she is OK with STI testing, will send urine for G/C and trich, could not rule out trichomonas on wet prep. Will trial AZO to rule out bladder spasms. Normal pelvic exam and OB US  was normal per patient, otherwise would recommend ultrasound of right ovary as this was palpable on exam. She will consider abdominal XR to assess for heavy stool burden. Return to office if symptoms persist or worsen.   Orders: -     DG Abd 1 View; Future -     C. trachomatis/N. gonorrhoeae RNA -     Trichomonas vaginalis, RNA  Dysuria -     Urinalysis, Routine w reflex microscopic  Other orders -     Urinalysis, Routine w reflex microscopic -     Microscopic Message     Follow up plan: Return if symptoms worsen or fail to improve.  Jeoffrey GORMAN Barrio, FNP

## 2023-05-07 LAB — URINE CULTURE
MICRO NUMBER:: 15910164
Result:: NO GROWTH
SPECIMEN QUALITY:: ADEQUATE

## 2023-05-07 LAB — TRICHOMONAS VAGINALIS, PROBE AMP: Trichomonas vaginalis RNA: NOT DETECTED

## 2023-05-07 LAB — C. TRACHOMATIS/N. GONORRHOEAE RNA
C. trachomatis RNA, TMA: NOT DETECTED
N. gonorrhoeae RNA, TMA: NOT DETECTED

## 2023-05-07 LAB — WET PREP FOR TRICH, YEAST, CLUE

## 2023-05-10 ENCOUNTER — Encounter: Payer: Self-pay | Admitting: Family Medicine

## 2023-05-11 ENCOUNTER — Ambulatory Visit
Admission: RE | Admit: 2023-05-11 | Discharge: 2023-05-11 | Disposition: A | Discharge status: Home / Self Care | Payer: BC Managed Care – PPO | Source: Ambulatory Visit | Attending: Family Medicine | Admitting: Family Medicine

## 2023-05-11 DIAGNOSIS — R1030 Lower abdominal pain, unspecified: Secondary | ICD-10-CM | POA: Diagnosis not present

## 2023-05-11 DIAGNOSIS — N949 Unspecified condition associated with female genital organs and menstrual cycle: Secondary | ICD-10-CM

## 2023-05-12 ENCOUNTER — Encounter: Payer: Self-pay | Admitting: Family Medicine

## 2023-05-14 ENCOUNTER — Ambulatory Visit: Payer: BC Managed Care – PPO | Admitting: Family Medicine

## 2023-07-07 ENCOUNTER — Ambulatory Visit: Admitting: Family Medicine

## 2023-07-07 ENCOUNTER — Encounter: Payer: Self-pay | Admitting: Family Medicine

## 2023-07-07 VITALS — BP 114/62 | HR 68 | Temp 97.7°F | Ht 66.0 in | Wt 121.0 lb

## 2023-07-07 DIAGNOSIS — R0781 Pleurodynia: Secondary | ICD-10-CM | POA: Diagnosis not present

## 2023-07-07 DIAGNOSIS — R059 Cough, unspecified: Secondary | ICD-10-CM

## 2023-07-07 MED ORDER — BENZONATATE 100 MG PO CAPS: 100.0000 mg | ORAL_CAPSULE | Freq: Three times a day (TID) | ORAL | 0 refills | Status: DC | PRN

## 2023-07-07 NOTE — Progress Notes (Signed)
 Patient Office Visit  Assessment & Plan:  Rib pain on right side -     DG Ribs Unilateral W/Chest Right; Future  Cough, unspecified type -     Benzonatate; Take 1 capsule (100 mg total) by mouth 3 (three) times daily as needed.  Dispense: 30 capsule; Refill: 0   Patient will do the x-ray at [facility] tomorrow.  Tessalon Perles 100 mg 3 times daily.  And cough.  No improvement worsening she is to notify us or go to the ER for further evaluation.  Told her that this may not be a good time to come off her antidepressant with recent stressor.  No follow-ups on file.   Subjective:    Patient ID: [name], female    DOB: [birth year]  Age: 53 y.o. MRN: [id number]  Chief Complaint  Patient presents with   Rib Injury    Pt c/o R rib pain for 2 weeks. Pt states she has had a cough x 4 week, non-productive.     HPI Right sided rib pain for the last 2 to 3 weeks with dry cough.  Patient has been having a nonproductive cough for  almost 3 weeks. Can take a deep breath without discomfort.  No shortness of breath no wheezing no fever or chills. Pt not sure if she has GERD Or not.  Patient is concerned that she may have broken a rib because of all the coughing.  Has no previous history of rib fracture or pneumonia or lung disease.  Patient is a non-smoker. OTC  Advil helps some but has not taken it consistently. Pt has been able to sleep.  Pt has had more stress lately, mother-in-law passed away recently and has noticed more PVCs which makes her cough.  States she does not take the Toprol-XL anymore.  Patient is also coming off her antidepressant which her husband does not think is a good idea.   The 10-year ASCVD risk score (Arnett DK, et al., 2019) is: 0.8%  Past Medical History:  Diagnosis Date   Anxiety    Anxiety and depression    C. difficile diarrhea    Depression    Esophageal reflux    Hyperlipidemia    PONV (postoperative nausea and vomiting)    PVC's (premature ventricular  contractions)    Past Surgical History:  Procedure Laterality Date   AUGMENTATION MAMMAPLASTY Bilateral 2006   Saline, In front of the muscle    CESAREAN SECTION     COLONOSCOPY     COLONOSCOPY  05/16/2012   Procedure: COLONOSCOPY;  Surgeon: Iva Boop, MD;  Location: [facility] ENDOSCOPY;  Service: Endoscopy;  Laterality: N/A;  fecal transplant   LUMBAR FUSION  1994, 1997   x 2, T11-L3   TONSILLECTOMY     UPPER GASTROINTESTINAL ENDOSCOPY     Social History   Tobacco Use   Smoking status: Never   Smokeless tobacco: Never  Substance Use Topics   Alcohol use: No   Drug use: No   Family History  Problem Relation Age of Onset   Diabetes Mother    Diabetes Father    Heart attack Sister    Hypertension Sister    Stroke Sister    Breast cancer Maternal Grandmother    Lymphoma Paternal Grandmother    Colon cancer Neg Hx    Stomach cancer Neg Hx    Allergies  Allergen Reactions   Codeine Nausea And Vomiting   Hydrocodone-Acetaminophen     REACTION: Palpitations  Morphine     REACTION: Nausea   Nitrofurantoin     REACTION: flu-like symtoms    ROS    Objective:    BP 114/62   Pulse 68   Temp 97.7 F (36.5 C)   Ht 5\' 6"  (1.676 m)   Wt 121 lb (54.9 kg)   LMP 04/06/2018 (Approximate)   SpO2 96%   BMI 19.53 kg/m  BP Readings from Last 3 Encounters:  07/07/23 114/62  05/06/23 120/70  04/26/23 116/62   Wt Readings from Last 3 Encounters:  07/07/23 121 lb (54.9 kg)  05/06/23 122 lb (55.3 kg)  04/26/23 123 lb 4 oz (55.9 kg)    Physical Exam Vitals and nursing note reviewed.  Constitutional:      Appearance: Normal appearance.  HENT:     Head: Normocephalic.     Right Ear: Tympanic membrane, ear canal and external ear normal.     Left Ear: Tympanic membrane, ear canal and external ear normal.  Eyes:     Extraocular Movements: Extraocular movements intact.     Conjunctiva/sclera: Conjunctivae normal.     Pupils: Pupils are equal, round, and reactive to  light.  Cardiovascular:     Rate and Rhythm: Normal rate and regular rhythm.     Heart sounds: Normal heart sounds.     Comments: No PVCs today Pulmonary:     Effort: Pulmonary effort is normal.     Breath sounds: Normal breath sounds. No wheezing.  Musculoskeletal:     Right lower leg: No edema.     Left lower leg: No edema.     Comments: Does have tenderness to the right's side of the rib cage under her breast.  No rash seen i.e. no vesicles.  No rash seen upper back area.  Neurological:     General: No focal deficit present.     Mental Status: She is alert and oriented to person, place, and time.  Psychiatric:        Mood and Affect: Mood normal.        Behavior: Behavior normal.      No results found for any visits on 07/07/23.

## 2023-07-08 ENCOUNTER — Encounter: Payer: Self-pay | Admitting: Family Medicine

## 2023-07-08 ENCOUNTER — Ambulatory Visit
Admission: RE | Admit: 2023-07-08 | Discharge: 2023-07-08 | Disposition: A | Discharge status: Home / Self Care | Source: Ambulatory Visit | Attending: Family Medicine | Admitting: Family Medicine

## 2023-07-08 DIAGNOSIS — R0781 Pleurodynia: Secondary | ICD-10-CM

## 2023-07-12 ENCOUNTER — Ambulatory Visit: Payer: Self-pay | Admitting: Family Medicine

## 2023-07-12 ENCOUNTER — Encounter: Payer: Self-pay | Admitting: Family Medicine

## 2023-07-12 ENCOUNTER — Other Ambulatory Visit: Payer: Self-pay

## 2023-07-12 DIAGNOSIS — R0781 Pleurodynia: Secondary | ICD-10-CM

## 2023-07-12 NOTE — Telephone Encounter (Signed)
 Chief Complaint: RUQ abdominal/right lower rib pain Symptoms: "pain under right lower ribs"/RUQ pain radiates to back and worsens with movement and painful to palpation  Frequency: x couple of weeks Pertinent Negatives: Patient denies vomiting, diarrhea, chest pain, difficulty breathing, nausea, sweating, fever Disposition: [] ED /[] Urgent Care (no appt availability in office) / [x] Appointment(In office/virtual)/ []  [facility]/ [] Home Care/ [x] Refused Recommended Disposition /[]  Mobile Bus/ []  Follow-up with PCP Additional Notes: Patient states she was seen in office last week and was sent for X-ray and told it was negative. She states she feels like she needs a CT. Patient states she already made an appointment tomorrow with Dr Tanya Nones and then states "I don't need to keep wasting my money coming in, I just need Dr Tanya Nones to order a CT scan". Offered office visit, patient refused.  Copied from CRM [id number]. Topic: Clinical - Red Word Triage >> Jul 12, 2023  8:17 AM Franchot Heidelberg wrote: Red Word that prompted transfer to Nurse Triage: Severe rib pain Reason for Disposition  [1] MODERATE pain (e.g., interferes with normal activities) AND [2] comes and goes (cramps) AND [3] present > 24 hours  (Exception: Pain with Vomiting or Diarrhea - see that Guideline.)  Answer Assessment - Initial Assessment Questions 1. LOCATION: "Where does it hurt?"      Right upper abdomen near ribs.  2. RADIATION: "Does the pain shoot anywhere else?" (e.g., chest, back)     Wraps around to right side of back.  3. ONSET: "When did the pain begin?" (e.g., minutes, hours or days ago)      Couple of weeks ago. Patient had a cough prior to pain.  4. SUDDEN: "Gradual or sudden onset?"     Sudden.  5. PATTERN "Does the pain come and go, or is it constant?"    - If it comes and goes: "How long does it last?" "Do you have pain now?"     (Note: Comes and goes means the pain is intermittent. It goes  away completely between bouts.)    - If constant: "Is it getting better, staying the same, or getting worse?"      (Note: Constant means the pain never goes away completely; most serious pain is constant and gets worse.)      Comes and goes, couple of minutes.  6. SEVERITY: "How bad is the pain?"  (e.g., Scale 1-10; mild, moderate, or severe)    - MILD (1-3): Doesn't interfere with normal activities, abdomen soft and not tender to touch..     - MODERATE (4-7): Interferes with normal activities or awakens from sleep, abdomen tender to touch.     - SEVERE (8-10): Excruciating pain, doubled over, unable to do any normal activities.       0/10 and states it is severe with movement such as twisting or turning.  7. RECURRENT SYMPTOM: "Have you ever had this type of stomach pain before?" If Yes, ask: "When was the last time?" and "What happened that time?"      Denies.  8. AGGRAVATING FACTORS: "Does anything seem to cause this pain?" (e.g., foods, stress, alcohol)     Worse with movement, and extremely painful to touch.  9. CARDIAC SYMPTOMS: "Do you have any of the following symptoms: chest pain, difficulty breathing, sweating, nausea?"      Denies.  10. OTHER SYMPTOMS: "Do you have any other symptoms?" (e.g., back pain, diarrhea, fever, urination pain, vomiting)       Denies.  Protocols used:  Abdominal Pain - Upper-A-AH

## 2023-07-13 ENCOUNTER — Encounter: Payer: Self-pay | Admitting: Family Medicine

## 2023-07-13 ENCOUNTER — Ambulatory Visit
Admission: RE | Admit: 2023-07-13 | Discharge: 2023-07-13 | Disposition: A | Discharge status: Home / Self Care | Source: Ambulatory Visit | Attending: Family Medicine | Admitting: Family Medicine

## 2023-07-13 ENCOUNTER — Ambulatory Visit: Admitting: Family Medicine

## 2023-07-13 DIAGNOSIS — R1011 Right upper quadrant pain: Secondary | ICD-10-CM | POA: Diagnosis not present

## 2023-07-13 DIAGNOSIS — R0781 Pleurodynia: Secondary | ICD-10-CM

## 2023-07-14 ENCOUNTER — Other Ambulatory Visit

## 2023-07-14 ENCOUNTER — Encounter: Payer: Self-pay | Admitting: Family Medicine

## 2023-07-14 ENCOUNTER — Ambulatory Visit: Admitting: Family Medicine

## 2023-07-14 ENCOUNTER — Other Ambulatory Visit: Payer: Self-pay | Admitting: Family Medicine

## 2023-07-14 VITALS — BP 112/62 | HR 85 | Temp 98.6°F | Ht 66.0 in | Wt 121.4 lb

## 2023-07-14 DIAGNOSIS — R0781 Pleurodynia: Secondary | ICD-10-CM

## 2023-07-14 DIAGNOSIS — R1011 Right upper quadrant pain: Secondary | ICD-10-CM | POA: Insufficient documentation

## 2023-07-14 MED ORDER — MELOXICAM 15 MG PO TABS: 15.0000 mg | ORAL_TABLET | Freq: Every day | ORAL | 1 refills | Status: DC

## 2023-07-14 NOTE — Progress Notes (Signed)
 Patient Office Visit  Assessment & Plan:  Rib pain on right side -     Meloxicam; Take 1 tablet (15 mg total) by mouth daily. Take with food  Dispense: 30 tablet; Refill: 1  RUQ pain   Reviewed the chest x-ray and abdominal ultrasound reports during the visit.  Told patient I did not think this pain did not appear to be c/w biliary colic so we did not order HIDA scan. Meloxicam 15 mg once a day with food.  If no improvement or worsening she is to notify us.  We could do a prednisone taper if meloxicam is not effective.  Offered stronger pain medicine i.e. tramadol to have on hand but patient declined. No follow-ups on file.   Subjective:    Patient ID: [name], female    DOB: Oct 18, 1970  Age: 53 y.o. MRN: [id number]  Chief Complaint  Patient presents with   Abdominal Pain    Right sided abdominal x 2 weeks. Advil decreases the pain temporarily.     Abdominal Pain   RUQ rib pain- Pt was seen last week on 07/07/23 with concerns for rib fracture due to ongoing coughing. Since last week pt continues to have right sided rib pain for the past 2 -3 weeks, also notices she has worsening pain under the rib cage especially if she changes positions from laying down to sitting up or if she is sleeping in bed and trying to roll to the left side. Pain can be severe 10/10 but fortunately lasts few seconds. Pt is able to walk and do usual activities but has not been able to do her weight lifting, i.e. 10 to 15 pound weights.  Patient has a pulling sensation in that area which sometimes radiates to the upper back area.  Patient does not have any discomfort with taking a deep breath and does not feel short winded or having shortness of breath.  Patient is no longer coughing which is good.  Patient not having nausea vomiting fever or chills and no changes in her bowel movements.  Patient has been using over-the-counter Advil but only takes 1 or 2 at a time and has not been taking it consistently.  Patient tried heat which made it worse. Patient wants to make sure there is nothing wrong in this area.  Pt has no hx of previous costochondritis. Patient had an x-ray with rib detail which did not reveal rib fracture and lungs were normal.  Patient also had a right upper quadrant ultrasound which was negative i.e. no gallstones or evidence of acute cholecystitis.   The 10-year ASCVD risk score (Arnett DK, et al., 2019) is: 0.8%  Past Medical History:  Diagnosis Date   Anxiety    Anxiety and depression    C. difficile diarrhea    Depression    Esophageal reflux    Hyperlipidemia    PONV (postoperative nausea and vomiting)    PVC's (premature ventricular contractions)    Past Surgical History:  Procedure Laterality Date   AUGMENTATION MAMMAPLASTY Bilateral 2006   Saline, In front of the muscle    CESAREAN SECTION     COLONOSCOPY     COLONOSCOPY  05/16/2012   Procedure: COLONOSCOPY;  Surgeon: Iva Boop, MD;  Location: [facility] ENDOSCOPY;  Service: Endoscopy;  Laterality: N/A;  fecal transplant   LUMBAR FUSION  1994, 1997   x 2, T11-L3   TONSILLECTOMY     UPPER GASTROINTESTINAL ENDOSCOPY     Social History  Tobacco Use   Smoking status: Never   Smokeless tobacco: Never  Substance Use Topics   Alcohol use: No   Drug use: No   Family History  Problem Relation Age of Onset   Diabetes Mother    Diabetes Father    Heart attack Sister    Hypertension Sister    Stroke Sister    Breast cancer Maternal Grandmother    Lymphoma Paternal Grandmother    Colon cancer Neg Hx    Stomach cancer Neg Hx    Allergies  Allergen Reactions   Codeine Nausea And Vomiting   Hydrocodone-Acetaminophen     REACTION: Palpitations   Morphine     REACTION: Nausea   Nitrofurantoin     REACTION: flu-like symtoms    Review of Systems  Gastrointestinal:  Positive for abdominal pain.      Objective:    BP 112/62   Pulse 85   Temp 98.6 F (37 C)   Ht 5\' 6"  (1.676 m)   Wt 121 lb 6 oz  (55.1 kg)   LMP 04/06/2018 (Approximate)   SpO2 97%   BMI 19.59 kg/m  BP Readings from Last 3 Encounters:  07/14/23 112/62  07/07/23 114/62  05/06/23 120/70   Wt Readings from Last 3 Encounters:  07/14/23 121 lb 6 oz (55.1 kg)  07/07/23 121 lb (54.9 kg)  05/06/23 122 lb (55.3 kg)    Physical Exam Vitals and nursing note reviewed.  Constitutional:      Appearance: Normal appearance.  HENT:     Head: Normocephalic.     Right Ear: Tympanic membrane, ear canal and external ear normal.     Left Ear: Tympanic membrane, ear canal and external ear normal.  Eyes:     Extraocular Movements: Extraocular movements intact.     Conjunctiva/sclera: Conjunctivae normal.     Pupils: Pupils are equal, round, and reactive to light.  Cardiovascular:     Rate and Rhythm: Normal rate and regular rhythm.     Heart sounds: Normal heart sounds.  Pulmonary:     Effort: Pulmonary effort is normal.     Breath sounds: Normal breath sounds.  Abdominal:     General: Bowel sounds are normal.     Tenderness: There is abdominal tenderness. There is no right CVA tenderness, left CVA tenderness, guarding or rebound.     Comments: Patient has exquisite tenderness over inferior edge of right anterior rib cage to palpation.   Musculoskeletal:     Right lower leg: No edema.     Left lower leg: No edema.  Skin:    Findings: No rash.  Neurological:     General: No focal deficit present.     Mental Status: She is alert and oriented to person, place, and time.  Psychiatric:        Mood and Affect: Mood normal.        Behavior: Behavior normal.        Thought Content: Thought content normal.        Judgment: Judgment normal.      No results found for any visits on 07/14/23.

## 2023-07-15 NOTE — Telephone Encounter (Signed)
 Requested medication (s) are due for refill today: no   Requested medication (s) are on the active medication list: no   Last refill:  discontinued 07/14/23  Future visit scheduled: no   Notes to clinic:  not delegated per protocol. Medication discontinued 07/14/23. Per protocol, do you want to discontinued     Requested Prescriptions  Pending Prescriptions Disp Refills   ALPRAZolam (XANAX) 0.5 MG tablet [Pharmacy Med Name: ALPRAZOLAM 0.5MG  TABLETS] 30 tablet     Sig: TAKE 1 TABLET(0.5 MG) BY MOUTH AT BEDTIME     Not Delegated - Psychiatry: Anxiolytics/Hypnotics 2 Failed - 07/15/2023  8:13 AM      Failed - This refill cannot be delegated      Failed - Urine Drug Screen completed in last 360 days      Failed - Valid encounter within last 6 months    Recent Outpatient Visits           1 year ago Palpitations   [facility] Donita Brooks, MD   2 years ago Suprapubic pressure   [facility] Tanya Nones, Priscille Heidelberg, MD   2 years ago Suprapubic pressure   [facility] Valentino Nose, NP   2 years ago GAD (generalized anxiety disorder)   [facility] Pickard, Priscille Heidelberg, MD   2 years ago General medical exam   [facility] Pickard, Priscille Heidelberg, MD              Passed - Patient is not pregnant

## 2023-07-19 ENCOUNTER — Other Ambulatory Visit: Payer: Self-pay

## 2023-07-19 ENCOUNTER — Telehealth: Payer: Self-pay

## 2023-07-19 ENCOUNTER — Ambulatory Visit
Admission: RE | Admit: 2023-07-19 | Discharge: 2023-07-19 | Disposition: A | Discharge status: Home / Self Care | Source: Ambulatory Visit | Attending: Family Medicine | Admitting: Family Medicine

## 2023-07-19 DIAGNOSIS — R0781 Pleurodynia: Secondary | ICD-10-CM | POA: Diagnosis not present

## 2023-07-19 MED ORDER — PREDNISONE 10 MG (21) PO TBPK: ORAL_TABLET | ORAL | 0 refills | Status: DC

## 2023-07-19 NOTE — Addendum Note (Signed)
 Addended by: Renee Pain on: 07/19/2023 10:19 AM   Modules accepted: Orders

## 2023-07-19 NOTE — Telephone Encounter (Signed)
 Copied from CRM [id number]. Topic: Appointments - Scheduling Inquiry for Clinic >> Jul 19, 2023 11:22 AM Antwanette L wrote: Reason for CRM: Claris Gower from [employer] is calling because [employer] needs a prior authorization for a ct chest scan. Claris Gower can be contacted at [phone] [phone]

## 2023-07-20 ENCOUNTER — Encounter: Payer: Self-pay | Admitting: Family Medicine

## 2023-07-20 ENCOUNTER — Encounter: Payer: Self-pay | Admitting: Cardiology

## 2023-07-20 DIAGNOSIS — Z85828 Personal history of other malignant neoplasm of skin: Secondary | ICD-10-CM | POA: Diagnosis not present

## 2023-07-20 DIAGNOSIS — D225 Melanocytic nevi of trunk: Secondary | ICD-10-CM | POA: Diagnosis not present

## 2023-07-20 DIAGNOSIS — L570 Actinic keratosis: Secondary | ICD-10-CM | POA: Diagnosis not present

## 2023-07-20 DIAGNOSIS — L910 Hypertrophic scar: Secondary | ICD-10-CM | POA: Diagnosis not present

## 2023-07-20 DIAGNOSIS — L821 Other seborrheic keratosis: Secondary | ICD-10-CM | POA: Diagnosis not present

## 2023-08-23 ENCOUNTER — Other Ambulatory Visit: Payer: Self-pay | Admitting: Family Medicine

## 2023-08-23 NOTE — Telephone Encounter (Signed)
 Copied from CRM [id number]. Topic: Clinical - Medication Refill >> Aug 23, 2023 10:43 AM Howard Macho wrote: Most Recent Primary Care Visit:  Provider: Amadeo June  Department: [facility]  Visit Type: ACUTE  Date: 07/14/2023  Medication: rosuvastatin  (CRESTOR ) 10 MG tablet  Has the patient contacted their pharmacy? Yes (Agent: If no, request that the patient contact the pharmacy for the refill. If patient does not wish to contact the pharmacy document the reason why and proceed with request.) (Agent: If yes, when and what did the pharmacy advise?)  Is this the correct pharmacy for this prescription? Yes If no, delete pharmacy and type the correct one.  This is the patient's preferred pharmacy:  [facility] [address], [place] - [address] AT [facility] [address] [place] [address] Phone: [phone] Fax: [phone]   Has the prescription been filled recently? No  Is the patient out of the medication? Yes  Has the patient been seen for an appointment in the last year OR does the patient have an upcoming appointment? Yes  Can we respond through MyChart? Yes  Agent: Please be advised that Rx refills may take up to 3 business days. We ask that you follow-up with your pharmacy.

## 2023-08-24 NOTE — Telephone Encounter (Signed)
 Requested Prescriptions  Pending Prescriptions Disp Refills   rosuvastatin  (CRESTOR ) 10 MG tablet [Pharmacy Med Name: ROSUVASTATIN  10MG  TABLETS] 90 tablet 0    Sig: TAKE 1 TABLET(10 MG) BY MOUTH DAILY     Cardiovascular:  Antilipid - Statins 2 Failed - 08/24/2023 10:42 AM      Failed - Valid encounter within last 12 months    Recent Outpatient Visits           1 month ago Rib pain on right side   [facility] Amadeo June, MD   1 month ago Rib pain on right side   [facility] Amadeo June, MD   3 months ago Vaginal discomfort   [facility] Jenelle Mis, FNP   4 months ago Dysuria   [facility] Pickard, Cisco Crest, MD   1 year ago Pure hypercholesterolemia   [place] [facility] Austine Lefort, MD              Failed - Lipid Panel in normal range within the last 12 months    Cholesterol  Date Value Ref Range Status  07/03/2022 168 <200 mg/dL Final   LDL Cholesterol (Calc)  Date Value Ref Range Status  07/03/2022 76 mg/dL (calc) Final    Comment:    Reference range: <100 . Desirable range <100 mg/dL for primary prevention;   <70 mg/dL for patients with CHD or diabetic patients  with > or = 2 CHD risk factors. Aaron Aas LDL-C is now calculated using the Martin-Hopkins  calculation, which is a validated novel method providing  better accuracy than the Friedewald equation in the  estimation of LDL-C.  Melinda Sprawls et al. Erroll Heard. 1914;782(95): 2061-2068  (http://education.[facility].com/faq/FAQ164)    HDL  Date Value Ref Range Status  07/03/2022 78 > OR = 50 mg/dL Final   Triglycerides  Date Value Ref Range Status  07/03/2022 56 <150 mg/dL Final         Passed - Cr in normal range and within 360 days    Creat  Date Value Ref Range Status  04/26/2023 0.69 0.50 - 1.03 mg/dL Final   Creatinine,U  Date Value Ref Range Status   09/08/2008  mg/dL Final   62.1 (NOTE)  Cutoff Values for Urine Drug Screen:        Drug Class           Cutoff (ng/mL)        Amphetamines            1000        Barbiturates             200        Cocaine Metabolites      300        Benzodiazepines          200        Methadone                 300        Opiates                 2000        Phencyclidine             25        Propoxyphene             300  Marijuana Metabolites     50  For medical purposes only.         Passed - Patient is not pregnant

## 2023-09-28 ENCOUNTER — Encounter: Payer: Self-pay | Admitting: Family Medicine

## 2023-09-28 ENCOUNTER — Ambulatory Visit: Payer: Self-pay

## 2023-09-28 DIAGNOSIS — Z85828 Personal history of other malignant neoplasm of skin: Secondary | ICD-10-CM | POA: Diagnosis not present

## 2023-09-28 DIAGNOSIS — B3731 Acute candidiasis of vulva and vagina: Secondary | ICD-10-CM | POA: Diagnosis not present

## 2023-09-28 DIAGNOSIS — L910 Hypertrophic scar: Secondary | ICD-10-CM | POA: Diagnosis not present

## 2023-09-28 DIAGNOSIS — N898 Other specified noninflammatory disorders of vagina: Secondary | ICD-10-CM | POA: Diagnosis not present

## 2023-09-28 DIAGNOSIS — L820 Inflamed seborrheic keratosis: Secondary | ICD-10-CM | POA: Diagnosis not present

## 2023-09-28 DIAGNOSIS — L814 Other melanin hyperpigmentation: Secondary | ICD-10-CM | POA: Diagnosis not present

## 2023-09-28 DIAGNOSIS — R3915 Urgency of urination: Secondary | ICD-10-CM | POA: Diagnosis not present

## 2023-09-28 NOTE — Telephone Encounter (Signed)
 Chief Complaint: Bladder Pressure, Anxiety Symptoms: Urgency Frequency: x 1 day Pertinent Negatives: Patient denies fever, hematuria, dysuria Disposition: [] ED /[x] Urgent Care (no appt availability in office) / [] Appointment(In office/virtual)/ []  [place] [facility]/ [] Home Care/ [] Refused Recommended Disposition /[] [facility]/ []  Follow-up with PCP Additional Notes: Pt reports symptoms x 1 day, no available OV. Advised UC, pt requests provider allow her to drop off urine sample at office. Pt also reports increased anxiety, feeling more emotional and crying a lot. Denies SI/HI, OV scheduled. This RN educated pt on home care, new-worsening symptoms, when to call back/seek emergent care. Pt verbalized understanding and agrees to plan.    Copied from CRM [id number]. Topic: Clinical - Red Word Triage >> Sep 28, 2023 11:28 AM Elle L wrote: Red Word that prompted transfer to Nurse Triage: The patient states she has been having anxiety and crying a lot. She also thinks she may be getting a UTI as she has pressure when urinating but no burning or pain. Reason for Disposition  Urinating more frequently than usual (i.e., frequency)  Answer Assessment - Initial Assessment Questions 1. SYMPTOM: "What's the main symptom you're concerned about?" (e.g., frequency, incontinence)     Bladder pressure when urinating 2. ONSET: "When did the symptoms start?"     X 1 days 3. PAIN: "Is there any pain?" If Yes, ask: "How bad is it?" (Scale: 1-10; mild, moderate, severe)     None 4. CAUSE: "What do you think is causing the symptoms?"     Possible UTI, history of cystitis 5. OTHER SYMPTOMS: "Do you have any other symptoms?" (e.g., blood in urine, fever, flank pain, pain with urination)     Urgency  Protocols used: Urinary Symptoms-A-AH

## 2023-09-30 DIAGNOSIS — N390 Urinary tract infection, site not specified: Secondary | ICD-10-CM | POA: Diagnosis not present

## 2023-09-30 DIAGNOSIS — R8290 Unspecified abnormal findings in urine: Secondary | ICD-10-CM | POA: Diagnosis not present

## 2023-09-30 DIAGNOSIS — R300 Dysuria: Secondary | ICD-10-CM | POA: Diagnosis not present

## 2023-10-04 ENCOUNTER — Other Ambulatory Visit

## 2023-10-04 DIAGNOSIS — R300 Dysuria: Secondary | ICD-10-CM | POA: Diagnosis not present

## 2023-10-05 ENCOUNTER — Ambulatory Visit: Payer: Self-pay | Admitting: Family Medicine

## 2023-10-05 ENCOUNTER — Telehealth: Payer: Self-pay

## 2023-10-05 LAB — URINE CULTURE
MICRO NUMBER:: 16526680
Result:: NO GROWTH
SPECIMEN QUALITY:: ADEQUATE

## 2023-10-05 LAB — URINALYSIS, ROUTINE W REFLEX MICROSCOPIC
Bilirubin Urine: NEGATIVE
Glucose, UA: NEGATIVE
Hgb urine dipstick: NEGATIVE
Leukocytes,Ua: NEGATIVE
Nitrite: NEGATIVE
Protein, ur: NEGATIVE
Specific Gravity, Urine: 1.027 (ref 1.001–1.035)
pH: <=5 — AB (ref 5.0–8.0)

## 2023-10-05 NOTE — Progress Notes (Signed)
 Pt. Informed. She is awaiting for a call aback from [facility] to report other symptoms.

## 2023-10-05 NOTE — Telephone Encounter (Signed)
 Copied from CRM [id number]. Topic: Appointments - Scheduling Inquiry for Clinic >> Oct 05, 2023  8:16 AM Ivette P wrote: Reason for CRM: Pt called in about her appt, pt believed her appt was today 10/05/2023, based on chart appt was originally made for 06/10 but pt says that she knows the appt was for today 06/03.  Pt refused triage of UTI Symptoms and wants to be seen today. Requesting a callback from [facility] Nurse.    Callback Mobile  [phone]

## 2023-10-07 DIAGNOSIS — R82998 Other abnormal findings in urine: Secondary | ICD-10-CM | POA: Diagnosis not present

## 2023-10-07 DIAGNOSIS — N898 Other specified noninflammatory disorders of vagina: Secondary | ICD-10-CM | POA: Diagnosis not present

## 2023-10-07 DIAGNOSIS — B3731 Acute candidiasis of vulva and vagina: Secondary | ICD-10-CM | POA: Diagnosis not present

## 2023-10-07 DIAGNOSIS — N951 Menopausal and female climacteric states: Secondary | ICD-10-CM | POA: Diagnosis not present

## 2023-10-07 DIAGNOSIS — N952 Postmenopausal atrophic vaginitis: Secondary | ICD-10-CM | POA: Diagnosis not present

## 2023-10-11 ENCOUNTER — Ambulatory Visit: Payer: Self-pay | Admitting: Family Medicine

## 2023-10-12 ENCOUNTER — Ambulatory Visit: Admitting: Family Medicine

## 2023-10-12 VITALS — BP 105/68 | HR 65 | Ht 66.0 in | Wt 121.8 lb

## 2023-10-12 DIAGNOSIS — F419 Anxiety disorder, unspecified: Secondary | ICD-10-CM | POA: Diagnosis not present

## 2023-10-12 DIAGNOSIS — H5203 Hypermetropia, bilateral: Secondary | ICD-10-CM | POA: Diagnosis not present

## 2023-10-12 DIAGNOSIS — R300 Dysuria: Secondary | ICD-10-CM | POA: Diagnosis not present

## 2023-10-12 DIAGNOSIS — N949 Unspecified condition associated with female genital organs and menstrual cycle: Secondary | ICD-10-CM

## 2023-10-12 LAB — URINALYSIS, ROUTINE W REFLEX MICROSCOPIC
Bacteria, UA: NONE SEEN /HPF
Bilirubin Urine: NEGATIVE
Glucose, UA: NEGATIVE
Hyaline Cast: NONE SEEN /LPF
Ketones, ur: NEGATIVE
Nitrite: NEGATIVE
Protein, ur: NEGATIVE
Specific Gravity, Urine: 1.01 (ref 1.001–1.035)
pH: 6.5 (ref 5.0–8.0)

## 2023-10-12 LAB — MICROSCOPIC MESSAGE

## 2023-10-12 MED ORDER — CITALOPRAM HYDROBROMIDE 10 MG PO TABS: 10.0000 mg | ORAL_TABLET | Freq: Every day | ORAL | 3 refills | Status: DC

## 2023-10-12 MED ORDER — SOLIFENACIN SUCCINATE 10 MG PO TABS: 10.0000 mg | ORAL_TABLET | Freq: Every day | ORAL | 3 refills | Status: AC

## 2023-10-12 NOTE — Progress Notes (Signed)
 Subjective:    Patient ID: [name], female    DOB: 06/07/1970, 53 y.o.   MRN: [id number] Patient continues to report urinary pressure and urgency.  Urinalysis was unremarkable at our office on June 2.  Urine culture was negative.  Gynecologist saw atrophic vaginitis and recommended topical estrogen.  Patient denies any history of bladder prolapse.  Does report increasing anxiety.  Several months ago, patient discontinued citalopram  because she felt like she did not need it.  However now she reports worsening anxiety.  Denies any nausea or vomiting or abdominal pain.  Past Medical History:  Diagnosis Date   Anxiety    Anxiety and depression    C. difficile diarrhea    Depression    Esophageal reflux    Hyperlipidemia    PONV (postoperative nausea and vomiting)    PVC's (premature ventricular contractions)    Past Surgical History:  Procedure Laterality Date   AUGMENTATION MAMMAPLASTY Bilateral 2006   Saline, In front of the muscle    CESAREAN SECTION     COLONOSCOPY     COLONOSCOPY  05/16/2012   Procedure: COLONOSCOPY;  Surgeon: Kenney Peacemaker, MD;  Location: [facility] ENDOSCOPY;  Service: Endoscopy;  Laterality: N/A;  fecal transplant   LUMBAR FUSION  1994, 1997   x 2, T11-L3   TONSILLECTOMY     UPPER GASTROINTESTINAL ENDOSCOPY     Current Outpatient Medications on File Prior to Visit  Medication Sig Dispense Refill   Calcium  Carb-Cholecalciferol (CALCIUM  500 +D PO) Take 500 mg by mouth daily.     estradiol (ESTRACE) 0.1 MG/GM vaginal cream Place 1 g vaginally 2 (two) times a week.     LYLLANA 0.05 MG/24HR patch Place 1 patch onto the skin 2 (two) times a week.     progesterone (PROMETRIUM) 100 MG capsule Take 100 mg by mouth daily.     rosuvastatin  (CRESTOR ) 10 MG tablet TAKE 1 TABLET(10 MG) BY MOUTH DAILY 90 tablet 0   No current facility-administered medications on file prior to visit.   Allergies  Allergen Reactions   Codeine Nausea And Vomiting    Hydrocodone-Acetaminophen     REACTION: Palpitations   Morphine     REACTION: Nausea   Nitrofurantoin     REACTION: flu-like symtoms   Social History   Socioeconomic History   Marital status: Married    Spouse name: Not on file   Number of children: 1   Years of education: Not on file   Highest education level: 12th grade  Occupational History   Occupation: Event organiser: [employer]  Tobacco Use   Smoking status: Never   Smokeless tobacco: Never  Vaping Use   Vaping status: Never Used  Substance and Sexual Activity   Alcohol use: No   Drug use: No   Sexual activity: Yes  Other Topics Concern   Not on file  Social History Narrative   Not on file   Social Drivers of Health   Financial Resource Strain: Low Risk  (10/12/2023)   Overall Financial Resource Strain (CARDIA)    Difficulty of Paying Living Expenses: Not hard at all  Food Insecurity: No Food Insecurity (10/12/2023)   Hunger Vital Sign    Worried About Running Out of Food in the Last Year: Never true    Ran Out of Food in the Last Year: Never true  Transportation Needs: No Transportation Needs (10/12/2023)   PRAPARE - Administrator, Civil Service (Medical): No  Lack of Transportation (Non-Medical): No  Physical Activity: Insufficiently Active (10/12/2023)   Exercise Vital Sign    Days of Exercise per Week: 4 days    Minutes of Exercise per Session: 30 min  Stress: No Stress Concern Present (10/12/2023)   [employer] of Occupational Health - Occupational Stress Questionnaire    Feeling of Stress : Only a little  Social Connections: Unknown (10/12/2023)   Social Connection and Isolation Panel [NHANES]    Frequency of Communication with Friends and Family: Three times a week    Frequency of Social Gatherings with Friends and Family: Once a week    Attends Religious Services: 1 to 4 times per year    Active Member of [employer] or Organizations: Yes    Attends Banker  Meetings: 1 to 4 times per year    Marital Status: Not on file  Intimate Partner Violence: Not on file     Review of Systems  Cardiovascular:  Positive for palpitations.  All other systems reviewed and are negative.      Objective:   Physical Exam Vitals reviewed.  Constitutional:      Appearance: Normal appearance. She is normal weight.  Cardiovascular:     Rate and Rhythm: Normal rate and regular rhythm.  Pulmonary:     Effort: Pulmonary effort is normal.     Breath sounds: Normal breath sounds.  Neurological:     General: No focal deficit present.     Mental Status: She is alert and oriented to person, place, and time.  Psychiatric:        Mood and Affect: Mood normal.        Behavior: Behavior normal.        Thought Content: Thought content normal.        Judgment: Judgment normal.           Assessment & Plan:  Vaginal discomfort - Plan: Urinalysis, Routine w reflex microscopic  Dysuria  Anxiety Urinalysis shows no evidence of urinary tract infection.  Urine culture is negative.  I believe the vaginal discomfort could be due to atrophic vaginitis.  Recommended the patient try the topical estrogen that her gynecologist recommended.  Resume citalopram  10 mg daily for anxiety.  Patient can use Azo temporarily for bladder spasms.  If Azo was not beneficial she can try Vesicare 10 mg daily.

## 2023-10-13 ENCOUNTER — Encounter: Payer: Self-pay | Admitting: Family Medicine

## 2023-10-15 ENCOUNTER — Other Ambulatory Visit

## 2023-10-15 DIAGNOSIS — E7800 Pure hypercholesterolemia, unspecified: Secondary | ICD-10-CM

## 2023-10-15 DIAGNOSIS — R300 Dysuria: Secondary | ICD-10-CM | POA: Diagnosis not present

## 2023-10-15 DIAGNOSIS — R5383 Other fatigue: Secondary | ICD-10-CM | POA: Diagnosis not present

## 2023-10-15 DIAGNOSIS — R002 Palpitations: Secondary | ICD-10-CM

## 2023-10-16 LAB — CBC WITH DIFFERENTIAL/PLATELET
Absolute Lymphocytes: 1826 {cells}/uL (ref 850–3900)
Absolute Monocytes: 386 {cells}/uL (ref 200–950)
Basophils Absolute: 87 {cells}/uL (ref 0–200)
Basophils Relative: 1.9 %
Eosinophils Absolute: 78 {cells}/uL (ref 15–500)
Eosinophils Relative: 1.7 %
HCT: 41.9 % (ref 35.0–45.0)
Hemoglobin: 13.5 g/dL (ref 11.7–15.5)
MCH: 29.7 pg (ref 27.0–33.0)
MCHC: 32.2 g/dL (ref 32.0–36.0)
MCV: 92.1 fL (ref 80.0–100.0)
MPV: 11 fL (ref 7.5–12.5)
Monocytes Relative: 8.4 %
Neutro Abs: 2222 {cells}/uL (ref 1500–7800)
Neutrophils Relative %: 48.3 %
Platelets: 215 10*3/uL (ref 140–400)
RBC: 4.55 10*6/uL (ref 3.80–5.10)
RDW: 12.5 % (ref 11.0–15.0)
Total Lymphocyte: 39.7 %
WBC: 4.6 10*3/uL (ref 3.8–10.8)

## 2023-10-16 LAB — COMPREHENSIVE METABOLIC PANEL WITH GFR
AG Ratio: 2.2 (calc) (ref 1.0–2.5)
ALT: 19 U/L (ref 6–29)
AST: 25 U/L (ref 10–35)
Albumin: 4.6 g/dL (ref 3.6–5.1)
Alkaline phosphatase (APISO): 50 U/L (ref 37–153)
BUN: 13 mg/dL (ref 7–25)
CO2: 31 mmol/L (ref 20–32)
Calcium: 10.7 mg/dL — ABNORMAL HIGH (ref 8.6–10.4)
Chloride: 105 mmol/L (ref 98–110)
Creat: 0.75 mg/dL (ref 0.50–1.03)
Globulin: 2.1 g/dL (ref 1.9–3.7)
Glucose, Bld: 84 mg/dL (ref 65–99)
Potassium: 5.3 mmol/L (ref 3.5–5.3)
Sodium: 142 mmol/L (ref 135–146)
Total Bilirubin: 0.5 mg/dL (ref 0.2–1.2)
Total Protein: 6.7 g/dL (ref 6.1–8.1)
eGFR: 95 mL/min/{1.73_m2} (ref >=60)

## 2023-10-16 LAB — LIPID PANEL
Cholesterol: 155 mg/dL (ref <200)
HDL: 75 mg/dL (ref >=50)
LDL Cholesterol (Calc): 66 mg/dL
Non-HDL Cholesterol (Calc): 80 mg/dL (ref <130)
Total CHOL/HDL Ratio: 2.1 (calc) (ref <5.0)
Triglycerides: 64 mg/dL (ref <150)

## 2023-10-18 ENCOUNTER — Ambulatory Visit: Payer: Self-pay | Admitting: Family Medicine

## 2023-10-19 ENCOUNTER — Other Ambulatory Visit

## 2023-10-19 DIAGNOSIS — R300 Dysuria: Secondary | ICD-10-CM | POA: Diagnosis not present

## 2023-10-20 ENCOUNTER — Telehealth: Payer: Self-pay

## 2023-10-20 ENCOUNTER — Encounter: Payer: Self-pay | Admitting: Family Medicine

## 2023-10-20 LAB — URINALYSIS, ROUTINE W REFLEX MICROSCOPIC
Bacteria, UA: NONE SEEN /HPF
Bilirubin Urine: NEGATIVE
Glucose, UA: NEGATIVE
Hgb urine dipstick: NEGATIVE
Hyaline Cast: NONE SEEN /LPF
Ketones, ur: NEGATIVE
Nitrite: NEGATIVE
Protein, ur: NEGATIVE
RBC / HPF: NONE SEEN /HPF (ref 0–2)
Specific Gravity, Urine: 1.008 (ref 1.001–1.035)
pH: 6 (ref 5.0–8.0)

## 2023-10-20 LAB — MICROSCOPIC MESSAGE

## 2023-10-20 NOTE — Telephone Encounter (Signed)
 Copied from CRM [id number]. Topic: Clinical - Lab/Test Results >> Oct 20, 2023  8:05 AM Ivette P wrote: Reason for CRM: pt would like  for the nurse of Dr. Cheril Cork to reach out to pt. Pt saw results this morning from myChart and has some further questions.   Pls follow up with pt [phone]

## 2023-10-21 ENCOUNTER — Encounter: Payer: Self-pay | Admitting: Family Medicine

## 2023-10-21 ENCOUNTER — Ambulatory Visit: Payer: Self-pay | Admitting: Family Medicine

## 2023-10-21 ENCOUNTER — Other Ambulatory Visit: Payer: Self-pay | Admitting: Family Medicine

## 2023-10-21 ENCOUNTER — Ambulatory Visit
Admission: RE | Admit: 2023-10-21 | Discharge: 2023-10-21 | Disposition: A | Discharge status: Home / Self Care | Source: Ambulatory Visit | Attending: Family Medicine | Admitting: Family Medicine

## 2023-10-21 DIAGNOSIS — R102 Pelvic and perineal pain: Secondary | ICD-10-CM

## 2023-10-21 LAB — URINE CULTURE
MICRO NUMBER:: 16590494
Result:: NO GROWTH
SPECIMEN QUALITY:: ADEQUATE

## 2023-10-22 ENCOUNTER — Ambulatory Visit: Admitting: Family Medicine

## 2023-10-22 ENCOUNTER — Encounter: Payer: Self-pay | Admitting: Family Medicine

## 2023-10-22 VITALS — BP 107/70 | HR 97 | Temp 98.1°F | Ht 66.0 in | Wt 121.6 lb

## 2023-10-22 DIAGNOSIS — R102 Pelvic and perineal pain: Secondary | ICD-10-CM

## 2023-10-22 NOTE — Progress Notes (Signed)
 Subjective:    Patient ID: [name], female    DOB: 08/17/1970, 53 y.o.   MRN: [id number] Patient continues to report urinary pressure and urgency.  Urine culture has been negative on 2 separate occasions.  The patient denies any dysuria.  Instead she feels a pressure and a constant feeling that she has to urinate.  She has not tried the Vesicare  I recommended at her last office visit.  Today I recommended that we perform a pelvic exam.  On pelvic exam, the introitus appears normal.  There is no evidence of any cystocele or rectocele.  There is no prolapse of the bladder.  There is no uterine prolapse.  The cervix appears healthy and normal.  The patient may have some mild mucosal dryness but no significant atrophic tinnitus  Past Medical History:  Diagnosis Date   Anxiety    Anxiety and depression    C. difficile diarrhea    Depression    Esophageal reflux    Hyperlipidemia    PONV (postoperative nausea and vomiting)    PVC's (premature ventricular contractions)    Past Surgical History:  Procedure Laterality Date   AUGMENTATION MAMMAPLASTY Bilateral 2006   Saline, In front of the muscle    CESAREAN SECTION     COLONOSCOPY     COLONOSCOPY  05/16/2012   Procedure: COLONOSCOPY;  Surgeon: Kenney Peacemaker, MD;  Location: [facility] ENDOSCOPY;  Service: Endoscopy;  Laterality: N/A;  fecal transplant   LUMBAR FUSION  1994, 1997   x 2, T11-L3   TONSILLECTOMY     UPPER GASTROINTESTINAL ENDOSCOPY     Current Outpatient Medications on File Prior to Visit  Medication Sig Dispense Refill   citalopram  (CELEXA ) 10 MG tablet Take 1 tablet (10 mg total) by mouth daily. 90 tablet 3   estradiol (ESTRACE) 0.1 MG/GM vaginal cream Place 1 g vaginally 2 (two) times a week.     rosuvastatin  (CRESTOR ) 10 MG tablet TAKE 1 TABLET(10 MG) BY MOUTH DAILY 90 tablet 0   solifenacin  (VESICARE ) 10 MG tablet Take 1 tablet (10 mg total) by mouth daily. 30 tablet 3   Calcium  Carb-Cholecalciferol (CALCIUM  500 +D PO)  Take 500 mg by mouth daily. (Patient not taking: Reported on 10/22/2023)     LYLLANA 0.05 MG/24HR patch Place 1 patch onto the skin 2 (two) times a week. (Patient not taking: Reported on 10/22/2023)     progesterone (PROMETRIUM) 100 MG capsule Take 100 mg by mouth daily. (Patient not taking: Reported on 10/22/2023)     No current facility-administered medications on file prior to visit.   Allergies  Allergen Reactions   Codeine Nausea And Vomiting   Hydrocodone-Acetaminophen     REACTION: Palpitations   Morphine     REACTION: Nausea   Nitrofurantoin     REACTION: flu-like symtoms   Social History   Socioeconomic History   Marital status: Married    Spouse name: Not on file   Number of children: 1   Years of education: Not on file   Highest education level: 12th grade  Occupational History   Occupation: Event organiser: [employer]  Tobacco Use   Smoking status: Never   Smokeless tobacco: Never  Vaping Use   Vaping status: Never Used  Substance and Sexual Activity   Alcohol use: No   Drug use: No   Sexual activity: Yes  Other Topics Concern   Not on file  Social History Narrative   Not on  file   Social Drivers of Health   Financial Resource Strain: Low Risk  (10/12/2023)   Overall Financial Resource Strain (CARDIA)    Difficulty of Paying Living Expenses: Not hard at all  Food Insecurity: No Food Insecurity (10/12/2023)   Hunger Vital Sign    Worried About Running Out of Food in the Last Year: Never true    Ran Out of Food in the Last Year: Never true  Transportation Needs: No Transportation Needs (10/12/2023)   PRAPARE - Administrator, Civil Service (Medical): No    Lack of Transportation (Non-Medical): No  Physical Activity: Insufficiently Active (10/12/2023)   Exercise Vital Sign    Days of Exercise per Week: 4 days    Minutes of Exercise per Session: 30 min  Stress: No Stress Concern Present (10/12/2023)   [employer] of  Occupational Health - Occupational Stress Questionnaire    Feeling of Stress : Only a little  Social Connections: Unknown (10/12/2023)   Social Connection and Isolation Panel    Frequency of Communication with Friends and Family: Three times a week    Frequency of Social Gatherings with Friends and Family: Once a week    Attends Religious Services: 1 to 4 times per year    Active Member of [employer] or Organizations: Yes    Attends Banker Meetings: 1 to 4 times per year    Marital Status: Not on file  Intimate Partner Violence: Not on file     Review of Systems  Cardiovascular:  Positive for palpitations.  All other systems reviewed and are negative.      Objective:   Physical Exam Vitals reviewed. Exam conducted with a chaperone present.  Constitutional:      Appearance: Normal appearance. She is normal weight.   Cardiovascular:     Rate and Rhythm: Normal rate and regular rhythm.  Pulmonary:     Effort: Pulmonary effort is normal.     Breath sounds: Normal breath sounds.  Abdominal:     Hernia: There is no hernia in the left inguinal area or right inguinal area.  Genitourinary:    Exam position: Lithotomy position.     Pubic Area: No rash.      Labia:        Right: No rash.        Left: No rash.      Urethra: No prolapse, urethral swelling or urethral lesion.     Vagina: No signs of injury and foreign body. No erythema.     Cervix: Normal. No cervical motion tenderness, friability or erythema.     Uterus: Not enlarged, not tender and no uterine prolapse.      Adnexa: Right adnexa normal and left adnexa normal.  Lymphadenopathy:     Lower Body: No right inguinal adenopathy. No left inguinal adenopathy.   Neurological:     General: No focal deficit present.     Mental Status: She is alert and oriented to person, place, and time.   Psychiatric:        Mood and Affect: Mood normal.        Behavior: Behavior normal.        Thought Content: Thought content  normal.        Judgment: Judgment normal.           Assessment & Plan:  Pelvic pressure in female I believe the patient is doing well with overactive bladder coupled bladder spasms.  She has had 2 separate  urine cultures that are negative.  Pelvic exam today is unremarkable.  The results of her pelvic ultrasound are pending however I do not appreciate any mass or fibroids on exam today.  Recommended trying Vesicare  10 mg a day

## 2023-10-29 ENCOUNTER — Other Ambulatory Visit: Payer: Self-pay | Admitting: Family Medicine

## 2023-10-29 ENCOUNTER — Emergency Department (HOSPITAL_BASED_OUTPATIENT_CLINIC_OR_DEPARTMENT_OTHER)

## 2023-10-29 ENCOUNTER — Emergency Department (HOSPITAL_BASED_OUTPATIENT_CLINIC_OR_DEPARTMENT_OTHER)
Admission: EM | Admit: 2023-10-29 | Discharge: 2023-10-29 | Disposition: A | Discharge status: Home / Self Care | Attending: Emergency Medicine | Admitting: Emergency Medicine

## 2023-10-29 ENCOUNTER — Other Ambulatory Visit: Payer: Self-pay

## 2023-10-29 ENCOUNTER — Encounter (HOSPITAL_BASED_OUTPATIENT_CLINIC_OR_DEPARTMENT_OTHER): Payer: Self-pay | Admitting: Emergency Medicine

## 2023-10-29 ENCOUNTER — Ambulatory Visit: Payer: Self-pay | Admitting: Family Medicine

## 2023-10-29 ENCOUNTER — Telehealth: Payer: Self-pay

## 2023-10-29 DIAGNOSIS — R350 Frequency of micturition: Secondary | ICD-10-CM | POA: Diagnosis not present

## 2023-10-29 DIAGNOSIS — R102 Pelvic and perineal pain: Secondary | ICD-10-CM | POA: Diagnosis not present

## 2023-10-29 DIAGNOSIS — N8003 Adenomyosis of the uterus: Secondary | ICD-10-CM | POA: Diagnosis not present

## 2023-10-29 DIAGNOSIS — M6289 Other specified disorders of muscle: Secondary | ICD-10-CM | POA: Diagnosis not present

## 2023-10-29 DIAGNOSIS — N854 Malposition of uterus: Secondary | ICD-10-CM | POA: Diagnosis not present

## 2023-10-29 DIAGNOSIS — N858 Other specified noninflammatory disorders of uterus: Secondary | ICD-10-CM

## 2023-10-29 DIAGNOSIS — R9389 Abnormal findings on diagnostic imaging of other specified body structures: Secondary | ICD-10-CM | POA: Diagnosis not present

## 2023-10-29 LAB — URINALYSIS, W/ REFLEX TO CULTURE (INFECTION SUSPECTED)
Bacteria, UA: NONE SEEN
Bilirubin Urine: NEGATIVE
Glucose, UA: NEGATIVE mg/dL
Hgb urine dipstick: NEGATIVE
Ketones, ur: NEGATIVE mg/dL
Leukocytes,Ua: NEGATIVE
Nitrite: NEGATIVE
Protein, ur: NEGATIVE mg/dL
Specific Gravity, Urine: 1.006 (ref 1.005–1.030)
pH: 6.5 (ref 5.0–8.0)

## 2023-10-29 MED ORDER — GADOBUTROL 1 MMOL/ML IV SOLN
5.0000 mL | Freq: Once | INTRAVENOUS | Status: AC | PRN
  Administered 2023-10-29: 5 mL via INTRAVENOUS

## 2023-10-29 NOTE — ED Triage Notes (Signed)
 Pt caox4, NAD c/o pelvic pain x1 mo. Multiple tests have been done including US  with recommended MRI which is why pt was told to go to ED today.

## 2023-10-29 NOTE — ED Notes (Signed)
 Patient returned from MRI.

## 2023-10-29 NOTE — Telephone Encounter (Signed)
 Copied from CRM [id number]. Topic: Referral - Question >> Oct 29, 2023  9:58 AM Everette C wrote: Reason for CRM: Cindy with [facility] would like to speak with a member of clinical staff about the patient's referral for an MRI. The patient has stressed the urgency of their request for imaging and would like to be seen sooner [phone] to discuss further if/when possible

## 2023-10-29 NOTE — Discharge Instructions (Signed)
 Please follow-up with your outpatient primary care provider and your OB/GYN.  Seek emergency care if experiencing any new or worsening symptoms.

## 2023-10-29 NOTE — ED Provider Notes (Signed)
 [place] EMERGENCY DEPARTMENT AT [facility] Provider Note   CSN: [id number] Arrival date & time: 10/29/23  1023     Patient presents with: No chief complaint on file.   [name] is a 53 y.o. female with PMHx anxiety, depression, GERD, HLD, who presents to ED requesting MRI imaging of her pelvis. Patient has been suffering with pelvic pressure and intermittent urinary frequency x1 month. Patient follows with PCP for this complaint. Patient recently had an outpatient transvaginal US  which recommended further workup with MRI. Patient stating that the secretary at her PCP office told her to come to ED for MRI since they weren't sure if her insurance would cover the outpatient MRI.  Patient also concerned that she might have a UTI right now d/t the urinary frequency. Patient endorsing these symptoms with multiple negative urine cultures in the recent past. Patient denies dysuria or hematuria.   Denies fever, nausea, vomiting, diarrhea, dysuria, hematuria.     HPI     Prior to Admission medications   Medication Sig Start Date End Date Taking? Authorizing Provider  Calcium  Carb-Cholecalciferol (CALCIUM  500 +D PO) Take 500 mg by mouth daily. Patient not taking: Reported on 10/22/2023    [provider]  citalopram  (CELEXA ) 10 MG tablet Take 1 tablet (10 mg total) by mouth daily. 10/12/23   Duanne Butler DASEN, MD  estradiol (ESTRACE) 0.1 MG/GM vaginal cream Place 1 g vaginally 2 (two) times a week. 10/08/23   [provider]  LYLLANA 0.05 MG/24HR patch Place 1 patch onto the skin 2 (two) times a week. Patient not taking: Reported on 10/22/2023 10/11/23   [provider]  progesterone (PROMETRIUM) 100 MG capsule Take 100 mg by mouth daily. Patient not taking: Reported on 10/22/2023 10/08/23   [provider]  rosuvastatin  (CRESTOR ) 10 MG tablet TAKE 1 TABLET(10 MG) BY MOUTH DAILY 08/24/23   Duanne Butler DASEN, MD  solifenacin  (VESICARE ) 10 MG tablet  Take 1 tablet (10 mg total) by mouth daily. 10/12/23   Duanne Butler DASEN, MD    Allergies: Codeine, Hydrocodone-acetaminophen, Morphine, and Nitrofurantoin    Review of Systems  Genitourinary:  Positive for urgency.    Updated Vital Signs BP 138/87 (BP Location: Right Arm)   Pulse 84   Temp 98.6 F (37 C)   Resp 18   LMP 04/06/2018 (Approximate)   SpO2 97%   Physical Exam Vitals and nursing note reviewed.  Constitutional:      General: She is not in acute distress.    Appearance: She is not ill-appearing or toxic-appearing.  HENT:     Head: Normocephalic and atraumatic.     Mouth/Throat:     Mouth: Mucous membranes are moist.   Eyes:     General: No scleral icterus.       Right eye: No discharge.        Left eye: No discharge.     Conjunctiva/sclera: Conjunctivae normal.    Cardiovascular:     Rate and Rhythm: Normal rate and regular rhythm.     Pulses: Normal pulses.     Heart sounds: Normal heart sounds. No murmur heard. Pulmonary:     Effort: Pulmonary effort is normal. No respiratory distress.     Breath sounds: Normal breath sounds. No wheezing, rhonchi or rales.  Abdominal:     General: Abdomen is flat. There is no distension.     Palpations: Abdomen is soft. There is no mass.     Tenderness: There is no  abdominal tenderness.   Musculoskeletal:     Right lower leg: No edema.     Left lower leg: No edema.   Skin:    General: Skin is warm and dry.     Findings: No rash.   Neurological:     General: No focal deficit present.     Mental Status: She is alert and oriented to person, place, and time. Mental status is at baseline.   Psychiatric:        Mood and Affect: Mood normal.        Behavior: Behavior normal.     (all labs ordered are listed, but only abnormal results are displayed) Labs Reviewed  URINALYSIS, W/ REFLEX TO CULTURE (INFECTION SUSPECTED) - Abnormal; Notable for the following components:      Result Value   Color, Urine COLORLESS  (*)    All other components within normal limits    EKG: None  Radiology: MR PELVIS W WO CONTRAST Result Date: 10/29/2023 CLINICAL DATA:  Fibroids suspected. EXAM: MRI PELVIS WITHOUT AND WITH CONTRAST TECHNIQUE: Multiplanar multisequence MR imaging of the pelvis was performed both before and after administration of intravenous contrast. CONTRAST:  5mL GADAVIST GADOBUTROL 1 MMOL/ML IV SOLN COMPARISON:  Pelvic ultrasound from 10/21/2023. FINDINGS: Urinary Tract: Limited evaluation of bilateral kidneys on coronal T2 weighted images. No focal mass or hydronephrosis seen. Urinary bladder is decompressed and not well evaluated. Bowel:  Unremarkable visualized pelvic bowel loops. Vascular/Lymphatic: No pathologically enlarged lymph nodes. No significant vascular abnormality seen. Reproductive: Normal-sized anteverted uterus. No focal mass. There is heterogeneous signal intensity of the uterus. The junctional zone is not well visualized. There is thickening of the endometrium measuring up to 1 cm in the fundal region. No discrete focal mass seen. Small/atrophic left ovary seen (series 3, image 14). Right ovary is likely seen on series 3, image 13. No large adnexal mass seen. Mucous plugging noted within the cervix. No focal cervical or vaginal mass seen. Other:  None. Musculoskeletal: No suspicious bone lesions identified. 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 IMPRESSION: 1. No focal uterine or adnexal mass seen. 2. There is thickening of the endometrium measuring up to 1 cm in the fundal region. No discrete focal endometrial mass seen. Correlate clinically and with tissue analysis, as clinically indicated. 3. Heterogeneous signal intensity of the uterus with ill-defined junctional zone. Findings are nonspecific but can be seen with adenomyosis. Electronically Signed   By: Ree Molt M.D.   On: 10/29/2023 13:31     Procedures   Medications Ordered in the ED  gadobutrol  (GADAVIST) 1 MMOL/ML injection 5 mL (5 mLs Intravenous Contrast Given 10/29/23 1300)                                    Medical Decision Making Amount and/or Complexity of Data Reviewed Radiology: ordered.  Risk Prescription drug management.    This patient presents to the ED for concern of abdominal pain, this involves an extensive number of treatment options, and is a complaint that carries with it a high risk of complications and morbidity.  The differential diagnosis includes gastroenteritis, colitis, small bowel obstruction, appendicitis, cholecystitis, pancreatitis, nephrolithiasis, UTI, pyelonephritis   Co morbidities that complicate the patient evaluation  anxiety, depression, GERD, HLD,   Additional history obtained:  6/19 transvaginal US : Heterogenous thickening and masslike appearance of the fundal endometrium and  adjacent myometrium with associated thickening of the endocervical lining . Endometrial malignancy/hyperplasia cannot be excluded. Differentials include submucosal fibroids or focal adenomyosis. Recommend dedicated pelvic MRI with contrast for further characterization. 10/22/2023 PCP visit: I believe the patient is doing well with overactive bladder coupled bladder spasms. She has had 2 separate urine cultures that are negative. Pelvic exam today is unremarkable. The results of her pelvic ultrasound are pending however I do not appreciate any mass or fibroids on exam today. Recommended trying Vesicare  10 mg a day    Problem List / ED Course / Critical interventions / Medication management  Patient presented to ED requesting pelvic MRI.  Patient also concerned that she might have a UTI.  Physical exam reassuring.  Patient afebrile with stable vitals. I Ordered, and personally interpreted labs.  UA not concerning for infection. I ordered imaging studies including pelvic MRI. I independently visualized and interpreted imaging and I agree with the radiologist  interpretation of adenomyosis and 1cm thickening of endometrium. Shared all results with patient.  Patient understands to follow-up with PCP and her OB/GYN. I have reviewed the patients home medicines and have made adjustments as needed The patient has been appropriately medically screened and/or stabilized in the ED. I have low suspicion for any other emergent medical condition which would require further screening, evaluation or treatment in the ED or require inpatient management. At time of discharge the patient is hemodynamically stable and in no acute distress. I have discussed work-up results and diagnosis with patient and answered all questions. Patient is agreeable with discharge plan. We discussed strict return precautions for returning to the emergency department and they verbalized understanding.     Social Determinants of Health:  none       Final diagnoses:  Adenomyosis  Pelvic floor dysfunction    ED Discharge Orders     None          Hoy Nidia FALCON, [place] 10/29/23 1402    Ellouise Richerd POUR, DO 10/29/23 1529

## 2023-11-02 ENCOUNTER — Other Ambulatory Visit: Payer: BC Managed Care – PPO

## 2023-11-02 DIAGNOSIS — R9389 Abnormal findings on diagnostic imaging of other specified body structures: Secondary | ICD-10-CM | POA: Diagnosis not present

## 2023-11-02 DIAGNOSIS — N882 Stricture and stenosis of cervix uteri: Secondary | ICD-10-CM | POA: Diagnosis not present

## 2023-11-04 ENCOUNTER — Other Ambulatory Visit

## 2023-11-17 DIAGNOSIS — Z01818 Encounter for other preprocedural examination: Secondary | ICD-10-CM | POA: Diagnosis not present

## 2023-11-17 DIAGNOSIS — Z719 Counseling, unspecified: Secondary | ICD-10-CM | POA: Diagnosis not present

## 2023-11-17 DIAGNOSIS — R9389 Abnormal findings on diagnostic imaging of other specified body structures: Secondary | ICD-10-CM | POA: Diagnosis not present

## 2023-11-17 DIAGNOSIS — N951 Menopausal and female climacteric states: Secondary | ICD-10-CM | POA: Diagnosis not present

## 2023-11-22 ENCOUNTER — Other Ambulatory Visit: Payer: Self-pay

## 2023-11-22 ENCOUNTER — Encounter: Payer: Self-pay | Admitting: Family Medicine

## 2023-11-22 MED ORDER — ROSUVASTATIN CALCIUM 10 MG PO TABS
10.0000 mg | ORAL_TABLET | Freq: Every day | ORAL | 3 refills | Status: AC
Start: 2023-11-22 — End: ?

## 2023-11-25 DIAGNOSIS — R9389 Abnormal findings on diagnostic imaging of other specified body structures: Secondary | ICD-10-CM | POA: Diagnosis not present

## 2023-12-06 DIAGNOSIS — Z79899 Other long term (current) drug therapy: Secondary | ICD-10-CM | POA: Diagnosis not present

## 2023-12-06 DIAGNOSIS — R3915 Urgency of urination: Secondary | ICD-10-CM | POA: Diagnosis not present

## 2023-12-06 DIAGNOSIS — R102 Pelvic and perineal pain: Secondary | ICD-10-CM | POA: Diagnosis not present

## 2023-12-06 DIAGNOSIS — R300 Dysuria: Secondary | ICD-10-CM | POA: Diagnosis not present

## 2023-12-06 DIAGNOSIS — R809 Proteinuria, unspecified: Secondary | ICD-10-CM | POA: Diagnosis not present

## 2023-12-09 ENCOUNTER — Ambulatory Visit: Admitting: Family Medicine

## 2023-12-13 ENCOUNTER — Ambulatory Visit: Admitting: Family Medicine

## 2023-12-15 DIAGNOSIS — R109 Unspecified abdominal pain: Secondary | ICD-10-CM | POA: Diagnosis not present

## 2023-12-30 DIAGNOSIS — Z1231 Encounter for screening mammogram for malignant neoplasm of breast: Secondary | ICD-10-CM | POA: Diagnosis not present

## 2024-01-25 DIAGNOSIS — K5901 Slow transit constipation: Secondary | ICD-10-CM | POA: Diagnosis not present

## 2024-01-25 DIAGNOSIS — N390 Urinary tract infection, site not specified: Secondary | ICD-10-CM | POA: Diagnosis not present

## 2024-01-25 DIAGNOSIS — N958 Other specified menopausal and perimenopausal disorders: Secondary | ICD-10-CM | POA: Diagnosis not present

## 2024-01-26 ENCOUNTER — Other Ambulatory Visit: Payer: Self-pay | Admitting: Obstetrics & Gynecology

## 2024-01-31 ENCOUNTER — Ambulatory Visit: Admitting: Family Medicine

## 2024-01-31 ENCOUNTER — Encounter: Payer: Self-pay | Admitting: Family Medicine

## 2024-01-31 VITALS — BP 110/56 | HR 69 | Temp 98.0°F | Ht 66.0 in | Wt 121.4 lb

## 2024-01-31 DIAGNOSIS — R102 Pelvic and perineal pain: Secondary | ICD-10-CM | POA: Diagnosis not present

## 2024-01-31 DIAGNOSIS — F419 Anxiety disorder, unspecified: Secondary | ICD-10-CM

## 2024-01-31 LAB — URINALYSIS, ROUTINE W REFLEX MICROSCOPIC
Bacteria, UA: NONE SEEN /HPF
Bilirubin Urine: NEGATIVE
Glucose, UA: NEGATIVE
Hyaline Cast: NONE SEEN /LPF
Ketones, ur: NEGATIVE
Leukocytes,Ua: NEGATIVE
Nitrite: NEGATIVE
Protein, ur: NEGATIVE
Specific Gravity, Urine: 1.025 (ref 1.001–1.035)
WBC, UA: NONE SEEN /HPF (ref 0–5)
pH: 6 (ref 5.0–8.0)

## 2024-01-31 LAB — MICROSCOPIC MESSAGE

## 2024-01-31 MED ORDER — CITALOPRAM HYDROBROMIDE 20 MG PO TABS: 20.0000 mg | ORAL_TABLET | Freq: Every day | ORAL | 3 refills | Status: AC

## 2024-01-31 MED ORDER — HYDROXYZINE HCL 25 MG PO TABS: 25.0000 mg | ORAL_TABLET | Freq: Three times a day (TID) | ORAL | 0 refills | Status: AC | PRN

## 2024-01-31 NOTE — Addendum Note (Signed)
 Addended by: ANGELENA RONAL SLATER MARLA on: 01/31/2024 09:47 AM   Modules accepted: Orders

## 2024-01-31 NOTE — Progress Notes (Signed)
 Subjective:    Patient ID: [name], female    DOB: January 10, 1971, 53 y.o.   MRN: [id number] 10/22/23 Patient continues to report urinary pressure and urgency.  Urine culture has been negative on 2 separate occasions.  The patient denies any dysuria.  Instead she feels a pressure and a constant feeling that she has to urinate.  She has not tried the Vesicare  I recommended at her last office visit.  Today I recommended that we perform a pelvic exam.  On pelvic exam, the introitus appears normal.  There is no evidence of any cystocele or rectocele.  There is no prolapse of the bladder.  There is no uterine prolapse.  The cervix appears healthy and normal.  The patient may have some mild mucosal dryness but no significant atrophic tinnitus.  At that time, my plan was: I believe the patient is doing well with overactive bladder coupled bladder spasms.  She has had 2 separate urine cultures that are negative.  Pelvic exam today is unremarkable.  The results of her pelvic ultrasound are pending however I do not appreciate any mass or fibroids on exam today.  Recommended trying Vesicare  10 mg a day  01/31/24 Eventually had mri of pelvis- IMPRESSION: 1. No focal uterine or adnexal mass seen. 2. There is thickening of the endometrium measuring up to 1 cm in the fundal region. No discrete focal endometrial mass seen. Correlate clinically and with tissue analysis, as clinically indicated. 3. Heterogeneous signal intensity of the uterus with ill-defined junctional zone. Findings are nonspecific but can be seen with adenomyosis.   Patient is scheduled to have a hysterectomy in November.  She continues to report intermittent pressure in her pelvis.  She denies any dysuria but she does report urgency and frequency.  Urinalysis today shows negative nitrates, negative leukocyte esterase, trace blood.  There is no evidence of a urinary tract infection.  She does report increasing anxiety and trouble sleeping.   She has health anxiety.  She admits that she is fixating on the pressure in her pelvis and this has her extremely worried.  She resumed Celexa  10 mg 4 weeks ago but she continues to have trouble sleeping she has yet to see any improvement after starting back on the Celexa   Past Medical History:  Diagnosis Date   Anxiety    Anxiety and depression    C. difficile diarrhea    Depression    Esophageal reflux    Hyperlipidemia    PONV (postoperative nausea and vomiting)    PVC's (premature ventricular contractions)    Past Surgical History:  Procedure Laterality Date   AUGMENTATION MAMMAPLASTY Bilateral 2006   Saline, In front of the muscle    CESAREAN SECTION     COLONOSCOPY     COLONOSCOPY  05/16/2012   Procedure: COLONOSCOPY;  Surgeon: Lupita FORBES Commander, MD;  Location: [facility] ENDOSCOPY;  Service: Endoscopy;  Laterality: N/A;  fecal transplant   LUMBAR FUSION  1994, 1997   x 2, T11-L3   TONSILLECTOMY     UPPER GASTROINTESTINAL ENDOSCOPY     Current Outpatient Medications on File Prior to Visit  Medication Sig Dispense Refill   Calcium  Carb-Cholecalciferol (CALCIUM  500 +D PO) Take 500 mg by mouth daily. (Patient not taking: Reported on 10/22/2023)     citalopram  (CELEXA ) 10 MG tablet Take 1 tablet (10 mg total) by mouth daily. 90 tablet 3   estradiol (ESTRACE) 0.1 MG/GM vaginal cream Place 1 g vaginally 2 (two) times a week.  LYLLANA 0.05 MG/24HR patch Place 1 patch onto the skin 2 (two) times a week. (Patient not taking: Reported on 10/22/2023)     progesterone (PROMETRIUM) 100 MG capsule Take 100 mg by mouth daily. (Patient not taking: Reported on 10/22/2023)     rosuvastatin  (CRESTOR ) 10 MG tablet Take 1 tablet (10 mg total) by mouth daily. 90 tablet 3   solifenacin  (VESICARE ) 10 MG tablet Take 1 tablet (10 mg total) by mouth daily. 30 tablet 3   No current facility-administered medications on file prior to visit.   Allergies  Allergen Reactions   Codeine Nausea And Vomiting    Hydrocodone-Acetaminophen     REACTION: Palpitations   Morphine     REACTION: Nausea   Nitrofurantoin     REACTION: flu-like symtoms   Social History   Socioeconomic History   Marital status: Married    Spouse name: Not on file   Number of children: 1   Years of education: Not on file   Highest education level: 12th grade  Occupational History   Occupation: Event organiser: [employer]  Tobacco Use   Smoking status: Never   Smokeless tobacco: Never  Vaping Use   Vaping status: Never Used  Substance and Sexual Activity   Alcohol use: No   Drug use: No   Sexual activity: Yes  Other Topics Concern   Not on file  Social History Narrative   Not on file   Social Drivers of Health   Financial Resource Strain: Low Risk  (10/12/2023)   Overall Financial Resource Strain (CARDIA)    Difficulty of Paying Living Expenses: Not hard at all  Food Insecurity: No Food Insecurity (10/12/2023)   Hunger Vital Sign    Worried About Running Out of Food in the Last Year: Never true    Ran Out of Food in the Last Year: Never true  Transportation Needs: No Transportation Needs (10/12/2023)   PRAPARE - Administrator, Civil Service (Medical): No    Lack of Transportation (Non-Medical): No  Physical Activity: Insufficiently Active (10/12/2023)   Exercise Vital Sign    Days of Exercise per Week: 4 days    Minutes of Exercise per Session: 30 min  Stress: No Stress Concern Present (10/12/2023)   [employer] of Occupational Health - Occupational Stress Questionnaire    Feeling of Stress : Only a little  Social Connections: Unknown (10/12/2023)   Social Connection and Isolation Panel    Frequency of Communication with Friends and Family: Three times a week    Frequency of Social Gatherings with Friends and Family: Once a week    Attends Religious Services: 1 to 4 times per year    Active Member of [employer] or Organizations: Yes    Attends Banker Meetings:  1 to 4 times per year    Marital Status: Not on file  Intimate Partner Violence: Not on file     Review of Systems  Cardiovascular:  Positive for palpitations.  All other systems reviewed and are negative.      Objective:   Physical Exam Vitals reviewed.  Constitutional:      Appearance: Normal appearance. She is normal weight.  Cardiovascular:     Rate and Rhythm: Normal rate and regular rhythm.  Pulmonary:     Effort: Pulmonary effort is normal.     Breath sounds: Normal breath sounds.  Genitourinary:    Uterus: Not enlarged, not tender and no uterine prolapse.  Adnexa: Right adnexa normal and left adnexa normal.  Neurological:     General: No focal deficit present.     Mental Status: She is alert and oriented to person, place, and time.  Psychiatric:        Mood and Affect: Mood normal.        Behavior: Behavior normal.        Thought Content: Thought content normal.        Judgment: Judgment normal.           Assessment & Plan:  Pelvic pressure in female  Anxiety  Differential diagnosis for the pelvic pressure includes pelvic floor dysfunction made worse by anxiety, adenomyosis, interstitial cystitis.  We have already tried the patient empirically on Vesicare  with no relief.  I would recommend a second opinion with urology to see if she would benefit from pelvic floor relaxation techniques and physical therapy prior to having surgery.  She may also benefit from cystoscopy to rule out interstitial cystitis.  If none of these conservative measures are working, she likely would benefit from a hysterectomy.  Meanwhile increase Celexa  to 20 mg a day and add hydroxyzine 25 mg every 8 hours as needed for anxiety or insomnia

## 2024-02-01 ENCOUNTER — Encounter: Payer: Self-pay | Admitting: Family Medicine

## 2024-02-08 DIAGNOSIS — N958 Other specified menopausal and perimenopausal disorders: Secondary | ICD-10-CM | POA: Diagnosis not present

## 2024-02-08 DIAGNOSIS — N3010 Interstitial cystitis (chronic) without hematuria: Secondary | ICD-10-CM | POA: Diagnosis not present

## 2024-02-15 DIAGNOSIS — N898 Other specified noninflammatory disorders of vagina: Secondary | ICD-10-CM | POA: Diagnosis not present

## 2024-02-15 DIAGNOSIS — R21 Rash and other nonspecific skin eruption: Secondary | ICD-10-CM | POA: Diagnosis not present

## 2024-03-16 ENCOUNTER — Encounter (HOSPITAL_COMMUNITY): Payer: Self-pay | Admitting: Obstetrics & Gynecology

## 2024-03-16 NOTE — Progress Notes (Addendum)
 Spoke w/ via phone for pre-op interview--- pt Lab needs dos----  ? Upt (per surgeon order,  pt is postmenopausal , pt stated  LMP 2021)       Lab results------ lab appt 03-22-2024 @ 0900 getting CBC/ BMP/ T&S COVID test -----patient states asymptomatic no test needed Arrive at -------  0530 on 03-23-2024  NPO after MN w/ exception sips of water w/ meds Pre-Surgery Ensure or G2:  n/a  Med rec completed Medications to take morning of surgery ----- if needed hydroxyzine Diabetic medication ----- n/a  GLP1 agonist last dose: n/a GLP1 instructions:  Patient instructed no nail polish to be worn day of surgery Patient instructed to bring photo id and insurance card day of surgery Patient aware to have Driver (ride ) / caregiver    for 24 hours after surgery - husband, [name] Patient Special Instructions ----- will pick up soap and written instructions at lab appt Pre-Op special Instructions ----- n/a  Patient verbalized understanding of instructions that were given at this phone interview. Patient denies chest pain, sob, fever, cough at the interview.

## 2024-03-16 NOTE — Progress Notes (Signed)
 Surgical Instructions  Your procedure is scheduled on :   Thursday,  03-23-2024 Report to [facility] Main Entrance A at 5:30 AM, then check in the Admitting office. Any questions or running late day of surgery :  call [phone]  Questions prior to your surgery day:  call [phone], Monday -- Friday 8am - 4pm. If you experience any cold or flu symptoms such as cough, fever, chills, shortness of breath, etc. between now and you scheduled surgery, please notify your surgeon office.   Remember: Do not eat any food after midnight the night before surgery.  This includes No water,  candy,  gum, and mints.  Take these medicines the morning of surgery with A SIPS OF WATER:  NONE   May take these medicines IF NEEDED:   Hydroxyzine (atarax)   One week prior to surgery, STOP taking any Aspirin (unless otherwise instructed by your surgeon) Aleve, Naproxen, ibuprofen, Motrin, Advil, Goody's, BC's, all herbal medications/ supplements, fish oil, and non-prescription vitamins.  Do NOT Smoke (tobacco/ vaping) and Do Not drink alcohol for 24 hours prior to your procedure.  For those patients that use a CPAP.  Please bring your CPAP/ mask/ tubing with them day of surgery . Anesthesia may ask recovery room nurse to use and if you stay the night you be asked to use it.  You will be asked to removed any contacts, glasses, piercing's, hearing aid's, dentures/ partials prior to surgery.  Please bring cases/ container/ solution/ etc., for them day of surgery.   Patients discharged the day of surgery will NOT be allowed to drive home.  You must have responsible driver and caregiver to stay at home with you the next 24 hours.  SURGICAL WAITING ROOM VISITATION Patients may have no more than 2 support people in the waiting area - if more than 2 , these visitors may rotate.  Pre-op nurse will coordinate an appropriate time for 1 Adult support person, who may not rotate, to accompany patient in pre-op.  Aware  some patients may have certain circumstances, speak to pre-op nurse day of surgery.  Children under the age 47 must have an adult with them who is not the patient and must remain in the main waiting area with an adult.  If the patient needs to stay at the hospital during part of their recovery, the visitor guidelines for inpatient rooms apply.  Please refer to the [facility] website for the visitor guidelines for any additional information.  If you received a COVID test during your pre-op visit it is requested that you wear a mask when out in public, stay away from anyone that may not be feeling well and notify your surgeon if you develop symptoms.  If you have been in contact with anyone that has tested positive in the past 10 days notify your surgeon.     [facility] - Preparing for Surgery  Before surgery, you can play an important role. Because skin is not sterile, it needs to be as free of germs as possible. You can reduce the number of germs on your skin by washing with CHG (chlorhexidine gluconate) soap before surgery. CHG is an antiseptic cleaner which kills germs and bonds with the skin to continue killing germs even after washing. Oral hygiene is also important in reducing the risk of infection. Remember to brush your teeth with your regular toothpaste the morning of surgery.  Please DO NOT use if you have an allergy to CHG or antibacterial soaps. If  your skin becomes reddened/irritated stop using the CHG and inform your Pre-op nurse day of surgery.  DO NOT shave (including legs and genital area) for at least 48 hours prior to your CHG shower.   Please follow these instructions carefully:  Shower with CHG soap the night before surgery. If you choose to wash your hair, wash your hair first as usual with your normal shampoo. After you shampoo, rinse your hair and body thoroughly to remove the shampoo. Use CHG as you would any other liquid soap. You can apply CHG directly to the skin  and wash gently with a clean washcloth or shower sponge. Apply the CHG soap to your body ONLY FROM THE NECK DOWN. Do not use on open wounds or open sores. Avoid contact with your eyes, ears, mouth, and genitals (private parts). Wash genitals (private parts) with your normal soap. Wash thoroughly, paying special attention to the area where your surgery will be performed. Thoroughly rinse your body with warm water from the neck down. DO NOT shower/wash with your normal soap after using and rinsing off the CHG soap. DO NOT use lotions, oils, etc., after showering with CHG. Pat yourself dry with a clean towel. Wear clean pajamas. Place clean sheets on your bed the night of your CHG shower and do not sleep with pets.  Day of Surgery  DO NOT Apply any lotions,  powder,  oils,  deodorants (may use underarm deodorant),  cologne/  perfumes  or makeup Do Not wear jewelry /  piercing's/  metal/  permanent jewelry must be removed prior to arrival day of surgery. (No plastic piercing) Do Not wear nail polish,  gel polish,  artificial nails, or any other type of covering on natural finger nails (toe nails are okay) Remember to brush your teeth and rinse mouth out. Put on clean / comfortable clothes. [facility] [place] is not responsible for valuables/ personal belongings

## 2024-03-21 ENCOUNTER — Other Ambulatory Visit (HOSPITAL_COMMUNITY)

## 2024-03-22 ENCOUNTER — Encounter (HOSPITAL_COMMUNITY)
Admission: RE | Admit: 2024-03-22 | Discharge: 2024-03-22 | Disposition: A | Discharge status: Home / Self Care | Source: Ambulatory Visit | Attending: Obstetrics & Gynecology | Admitting: Obstetrics & Gynecology

## 2024-03-22 DIAGNOSIS — Z01812 Encounter for preprocedural laboratory examination: Secondary | ICD-10-CM | POA: Insufficient documentation

## 2024-03-22 DIAGNOSIS — Z01818 Encounter for other preprocedural examination: Secondary | ICD-10-CM | POA: Diagnosis not present

## 2024-03-22 LAB — CBC
HCT: 43.5 % (ref 36.0–46.0)
Hemoglobin: 14.3 g/dL (ref 12.0–15.0)
MCH: 30 pg (ref 26.0–34.0)
MCHC: 32.9 g/dL (ref 30.0–36.0)
MCV: 91.4 fL (ref 80.0–100.0)
Platelets: 222 K/uL (ref 150–400)
RBC: 4.76 MIL/uL (ref 3.87–5.11)
RDW: 12.6 % (ref 11.5–15.5)
WBC: 9.5 K/uL (ref 4.0–10.5)
nRBC: 0 % (ref 0.0–0.2)

## 2024-03-22 LAB — BASIC METABOLIC PANEL WITH GFR
Anion gap: 11 (ref 5–15)
BUN: 12 mg/dL (ref 6–20)
CO2: 25 mmol/L (ref 22–32)
Calcium: 9.7 mg/dL (ref 8.9–10.3)
Chloride: 104 mmol/L (ref 98–111)
Creatinine, Ser: 0.7 mg/dL (ref 0.44–1.00)
GFR, Estimated: >60 mL/min (ref >60)
Glucose, Bld: 116 mg/dL — ABNORMAL HIGH (ref 70–99)
Potassium: 4.6 mmol/L (ref 3.5–5.1)
Sodium: 140 mmol/L (ref 135–145)

## 2024-03-22 LAB — TYPE AND SCREEN
ABO/RH(D): O POS
Antibody Screen: NEGATIVE
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
## 2024-03-22 NOTE — Anesthesia Preprocedure Evaluation (Signed)
 Anesthesia Evaluation  Patient identified by MRN, date of birth, ID band Patient awake    Reviewed: Allergy & Precautions, NPO status , Patient's Chart, lab work & pertinent test results  History of Anesthesia Complications Negative for: history of anesthetic complications  Airway Mallampati: II  TM Distance: >3 FB Neck ROM: Full    Dental  (+) Dental Advisory Given Braces on top teeth:   Pulmonary neg pulmonary ROS   breath sounds clear to auscultation       Cardiovascular (-) hypertension(-) angina (-) CAD (CAC of 0 10/17/2021), (-) Past MI, (-) Cardiac Stents and (-) CABG + dysrhythmias (iRBBB with LAFB) + Valvular Problems/Murmurs (mild) MR  Rhythm:Regular Rate:Normal  HLD  TTE 08/19/2022: IMPRESSIONS    1. Left ventricular ejection fraction, by estimation, is 60 to 65%. The  left ventricle has normal function. The left ventricle has no regional  wall motion abnormalities. Left ventricular diastolic parameters were  normal.   2. Right ventricular systolic function is normal. The right ventricular  size is normal. There is normal pulmonary artery systolic pressure. The  estimated right ventricular systolic pressure is 22.9 mmHg.   3. Borderline bileaflet mitral valve prolapse. The mitral valve is  grossly normal. Mild mitral valve regurgitation. No evidence of mitral  stenosis.   4. The aortic valve is tricuspid. Aortic valve regurgitation is not  visualized. No aortic stenosis is present.   5. The inferior vena cava is normal in size with greater than 50%  respiratory variability, suggesting right atrial pressure of 3 mmHg.     Neuro/Psych neg Seizures PSYCHIATRIC DISORDERS Anxiety Depression    Vertigo     GI/Hepatic Neg liver ROS,GERD  Controlled,,  Endo/Other  negative endocrine ROS    Renal/GU negative Renal ROS     Musculoskeletal   Abdominal   Peds  Hematology negative hematology ROS (+) Lab Results       Component                Value               Date                      WBC                      9.5                 03/22/2024                HGB                      14.3                03/22/2024                HCT                      43.5                03/22/2024                MCV                      91.4                03/22/2024                PLT  222                 03/22/2024              Anesthesia Other Findings   Reproductive/Obstetrics                              Anesthesia Physical Anesthesia Plan  ASA: 2  Anesthesia Plan: General   Post-op Pain Management: Tylenol  PO (pre-op)* and Ketamine  IV*   Induction: Intravenous  PONV Risk Score and Plan: 3 and Ondansetron , Dexamethasone , Midazolam , Treatment may vary due to age or medical condition and Scopolamine  patch - Pre-op  Airway Management Planned: Oral ETT  Additional Equipment:   Intra-op Plan:   Post-operative Plan: Extubation in OR  Informed Consent: I have reviewed the patients History and Physical, chart, labs and discussed the procedure including the risks, benefits and alternatives for the proposed anesthesia with the patient or authorized representative who has indicated his/her understanding and acceptance.     Dental advisory given  Plan Discussed with: Anesthesiologist and CRNA  Anesthesia Plan Comments: (Risks of general anesthesia discussed including, but not limited to, sore throat, hoarse voice, chipped/damaged teeth, injury to vocal cords, nausea and vomiting, allergic reactions, lung infection, heart attack, stroke, and death. All questions answered. )         Anesthesia Quick Evaluation

## 2024-03-23 ENCOUNTER — Encounter (HOSPITAL_COMMUNITY): Payer: Self-pay | Admitting: Anesthesiology

## 2024-03-23 ENCOUNTER — Other Ambulatory Visit: Payer: Self-pay

## 2024-03-23 ENCOUNTER — Encounter (HOSPITAL_COMMUNITY): Payer: Self-pay | Admitting: Obstetrics & Gynecology

## 2024-03-23 ENCOUNTER — Encounter (HOSPITAL_COMMUNITY)
Admission: RE | Disposition: A | Discharge status: Home / Self Care | Payer: Self-pay | Source: Home / Self Care | Attending: Obstetrics & Gynecology

## 2024-03-23 ENCOUNTER — Other Ambulatory Visit (HOSPITAL_COMMUNITY): Payer: Self-pay

## 2024-03-23 ENCOUNTER — Ambulatory Visit (HOSPITAL_COMMUNITY): Payer: Self-pay | Admitting: Anesthesiology

## 2024-03-23 ENCOUNTER — Ambulatory Visit (HOSPITAL_COMMUNITY)
Admission: RE | Admit: 2024-03-23 | Discharge: 2024-03-23 | Disposition: A | Discharge status: Home / Self Care | Attending: Obstetrics & Gynecology | Admitting: Obstetrics & Gynecology

## 2024-03-23 DIAGNOSIS — E785 Hyperlipidemia, unspecified: Secondary | ICD-10-CM | POA: Insufficient documentation

## 2024-03-23 DIAGNOSIS — N8003 Adenomyosis of the uterus: Secondary | ICD-10-CM | POA: Insufficient documentation

## 2024-03-23 DIAGNOSIS — N736 Female pelvic peritoneal adhesions (postinfective): Secondary | ICD-10-CM | POA: Diagnosis not present

## 2024-03-23 DIAGNOSIS — K219 Gastro-esophageal reflux disease without esophagitis: Secondary | ICD-10-CM | POA: Insufficient documentation

## 2024-03-23 DIAGNOSIS — Z780 Asymptomatic menopausal state: Secondary | ICD-10-CM | POA: Diagnosis not present

## 2024-03-23 DIAGNOSIS — R9389 Abnormal findings on diagnostic imaging of other specified body structures: Secondary | ICD-10-CM | POA: Diagnosis not present

## 2024-03-23 DIAGNOSIS — N888 Other specified noninflammatory disorders of cervix uteri: Secondary | ICD-10-CM | POA: Diagnosis not present

## 2024-03-23 DIAGNOSIS — N858 Other specified noninflammatory disorders of uterus: Secondary | ICD-10-CM | POA: Diagnosis not present

## 2024-03-23 DIAGNOSIS — Z01818 Encounter for other preprocedural examination: Secondary | ICD-10-CM

## 2024-03-23 HISTORY — DX: Abnormal findings on diagnostic imaging of other specified body structures: R93.89

## 2024-03-23 HISTORY — DX: Bifascicular block: I45.2

## 2024-03-23 HISTORY — DX: Nocturia: R35.1

## 2024-03-23 HISTORY — DX: Personal history of other malignant neoplasm of skin: Z85.828

## 2024-03-23 LAB — ABO/RH: ABO/RH(D): O POS

## 2024-03-23 SURGERY — HYSTERECTOMY, TOTAL, LAPAROSCOPIC, ROBOT-ASSISTED WITH SALPINGECTOMY
Anesthesia: General

## 2024-03-23 MED ORDER — OXYCODONE HCL 5 MG/5ML PO SOLN: 5.0000 mg | Freq: Once | ORAL | Status: DC | PRN

## 2024-03-23 MED ORDER — POVIDONE-IODINE 10 % EX SWAB: 2.0000 | Freq: Once | CUTANEOUS | Status: DC

## 2024-03-23 MED ORDER — IBUPROFEN 200 MG PO TABS
600.0000 mg | ORAL_TABLET | Freq: Four times a day (QID) | ORAL | 0 refills | Status: AC | PRN
  Filled 2024-03-23: qty 30, 3d supply, fill #0

## 2024-03-23 MED ORDER — GLYCOPYRROLATE PF 0.2 MG/ML IJ SOSY
PREFILLED_SYRINGE | INTRAMUSCULAR | Status: DC | PRN
  Administered 2024-03-23: .2 mg via INTRAVENOUS

## 2024-03-23 MED ORDER — SODIUM CHLORIDE 0.9 % IV SOLN
INTRAVENOUS | Status: DC | PRN
  Administered 2024-03-23: 47 mL
  Administered 2024-03-23: 13 mL

## 2024-03-23 MED ORDER — MIDAZOLAM HCL (PF) 2 MG/2ML IJ SOLN
INTRAMUSCULAR | Status: DC | PRN
  Administered 2024-03-23: 2 mg via INTRAVENOUS

## 2024-03-23 MED ORDER — SODIUM CHLORIDE 0.9 % IR SOLN
Status: DC | PRN
  Administered 2024-03-23: 1000 mL

## 2024-03-23 MED ORDER — EPHEDRINE 5 MG/ML INJ
INTRAVENOUS | Status: AC
  Filled 2024-03-23: qty 5

## 2024-03-23 MED ORDER — KETAMINE HCL 50 MG/5ML IJ SOSY
PREFILLED_SYRINGE | INTRAMUSCULAR | Status: DC | PRN
  Administered 2024-03-23: 20 mg via INTRAVENOUS

## 2024-03-23 MED ORDER — SUGAMMADEX SODIUM 200 MG/2ML IV SOLN
INTRAVENOUS | Status: DC | PRN
  Administered 2024-03-23: 200 mg via INTRAVENOUS

## 2024-03-23 MED ORDER — ALBUMIN HUMAN 5 % IV SOLN
INTRAVENOUS | Status: AC
  Filled 2024-03-23: qty 250

## 2024-03-23 MED ORDER — DOCUSATE SODIUM 100 MG PO CAPS
100.0000 mg | ORAL_CAPSULE | Freq: Two times a day (BID) | ORAL | 0 refills | Status: AC
  Filled 2024-03-23: qty 30, 15d supply, fill #0

## 2024-03-23 MED ORDER — CEFAZOLIN SODIUM-DEXTROSE 2-4 GM/100ML-% IV SOLN
INTRAVENOUS | Status: AC
  Filled 2024-03-23: qty 100

## 2024-03-23 MED ORDER — PROPOFOL 10 MG/ML IV BOLUS
INTRAVENOUS | Status: AC
  Filled 2024-03-23: qty 20

## 2024-03-23 MED ORDER — ONDANSETRON HCL 4 MG/2ML IJ SOLN
INTRAMUSCULAR | Status: DC | PRN
  Administered 2024-03-23: 4 mg via INTRAVENOUS

## 2024-03-23 MED ORDER — 0.9 % SODIUM CHLORIDE (POUR BTL) OPTIME
TOPICAL | Status: DC | PRN
  Administered 2024-03-23: 1000 mL

## 2024-03-23 MED ORDER — ROCURONIUM BROMIDE 10 MG/ML (PF) SYRINGE
PREFILLED_SYRINGE | INTRAVENOUS | Status: AC
  Filled 2024-03-23: qty 10

## 2024-03-23 MED ORDER — ONDANSETRON HCL 4 MG/2ML IJ SOLN
INTRAMUSCULAR | Status: AC
  Filled 2024-03-23: qty 2

## 2024-03-23 MED ORDER — TRAMADOL HCL 50 MG PO TABS
50.0000 mg | ORAL_TABLET | Freq: Four times a day (QID) | ORAL | 0 refills | Status: AC | PRN
  Filled 2024-03-23: qty 28, 7d supply, fill #0

## 2024-03-23 MED ORDER — ROCURONIUM BROMIDE 10 MG/ML (PF) SYRINGE
PREFILLED_SYRINGE | INTRAVENOUS | Status: DC | PRN
  Administered 2024-03-23: 20 mg via INTRAVENOUS
  Administered 2024-03-23: 50 mg via INTRAVENOUS

## 2024-03-23 MED ORDER — LACTATED RINGERS IV SOLN: INTRAVENOUS | Status: DC

## 2024-03-23 MED ORDER — MIDAZOLAM HCL 2 MG/2ML IJ SOLN
INTRAMUSCULAR | Status: AC
  Filled 2024-03-23: qty 2

## 2024-03-23 MED ORDER — EPHEDRINE SULFATE-NACL 50-0.9 MG/10ML-% IV SOSY
PREFILLED_SYRINGE | INTRAVENOUS | Status: DC | PRN
  Administered 2024-03-23: 5 mg via INTRAVENOUS

## 2024-03-23 MED ORDER — LIDOCAINE 2% (20 MG/ML) 5 ML SYRINGE
INTRAMUSCULAR | Status: AC
  Filled 2024-03-23: qty 5

## 2024-03-23 MED ORDER — ACETAMINOPHEN 500 MG PO TABS: 1000.0000 mg | ORAL_TABLET | Freq: Once | ORAL | Status: AC

## 2024-03-23 MED ORDER — CEFAZOLIN SODIUM-DEXTROSE 2-4 GM/100ML-% IV SOLN
2.0000 g | INTRAVENOUS | Status: AC
  Administered 2024-03-23: 2 g via INTRAVENOUS

## 2024-03-23 MED ORDER — SCOPOLAMINE 1 MG/3DAYS TD PT72
MEDICATED_PATCH | TRANSDERMAL | Status: AC
  Administered 2024-03-23: 1 mg via TRANSDERMAL
  Filled 2024-03-23: qty 1

## 2024-03-23 MED ORDER — LIDOCAINE 2% (20 MG/ML) 5 ML SYRINGE
INTRAMUSCULAR | Status: DC | PRN
  Administered 2024-03-23: 80 mg via INTRAVENOUS

## 2024-03-23 MED ORDER — FENTANYL CITRATE (PF) 250 MCG/5ML IJ SOLN
INTRAMUSCULAR | Status: DC | PRN
  Administered 2024-03-23: 25 ug via INTRAVENOUS
  Administered 2024-03-23: 50 ug via INTRAVENOUS
  Administered 2024-03-23: 25 ug via INTRAVENOUS

## 2024-03-23 MED ORDER — HYDROMORPHONE HCL 1 MG/ML IJ SOLN: 0.2500 mg | INTRAMUSCULAR | Status: DC | PRN

## 2024-03-23 MED ORDER — KETAMINE HCL 50 MG/5ML IJ SOSY
PREFILLED_SYRINGE | INTRAMUSCULAR | Status: AC
  Filled 2024-03-23: qty 5

## 2024-03-23 MED ORDER — AMISULPRIDE (ANTIEMETIC) 5 MG/2ML IV SOLN
10.0000 mg | Freq: Once | INTRAVENOUS | Status: AC | PRN
  Administered 2024-03-23: 10 mg via INTRAVENOUS

## 2024-03-23 MED ORDER — CHLORHEXIDINE GLUCONATE 0.12 % MT SOLN: 15.0000 mL | Freq: Once | OROMUCOSAL | Status: AC

## 2024-03-23 MED ORDER — OXYCODONE HCL 5 MG PO TABS: 5.0000 mg | ORAL_TABLET | Freq: Once | ORAL | Status: DC | PRN

## 2024-03-23 MED ORDER — FENTANYL CITRATE (PF) 100 MCG/2ML IJ SOLN
INTRAMUSCULAR | Status: AC
  Filled 2024-03-23: qty 2

## 2024-03-23 MED ORDER — PROPOFOL 10 MG/ML IV BOLUS
INTRAVENOUS | Status: DC | PRN
Start: 2024-03-23 — End: 2024-03-23
  Administered 2024-03-23: 150 mg via INTRAVENOUS

## 2024-03-23 MED ORDER — GLYCOPYRROLATE PF 0.2 MG/ML IJ SOSY
PREFILLED_SYRINGE | INTRAMUSCULAR | Status: AC
  Filled 2024-03-23: qty 1

## 2024-03-23 MED ORDER — PHENYLEPHRINE HCL-NACL 20-0.9 MG/250ML-% IV SOLN
INTRAVENOUS | Status: DC | PRN
  Administered 2024-03-23: 80 ug via INTRAVENOUS
  Administered 2024-03-23: 160 ug via INTRAVENOUS
  Administered 2024-03-23: 50 ug/min via INTRAVENOUS
  Administered 2024-03-23: 80 ug via INTRAVENOUS
  Administered 2024-03-23: 160 ug via INTRAVENOUS
  Administered 2024-03-23: 80 ug via INTRAVENOUS

## 2024-03-23 MED ORDER — AMISULPRIDE (ANTIEMETIC) 5 MG/2ML IV SOLN
INTRAVENOUS | Status: AC
  Filled 2024-03-23: qty 4

## 2024-03-23 MED ORDER — ORAL CARE MOUTH RINSE: 15.0000 mL | Freq: Once | OROMUCOSAL | Status: AC

## 2024-03-23 MED ORDER — ACETAMINOPHEN 325 MG PO TABS: 650.0000 mg | ORAL_TABLET | Freq: Four times a day (QID) | ORAL | Status: AC | PRN

## 2024-03-23 MED ORDER — ACETAMINOPHEN 500 MG PO TABS
ORAL_TABLET | ORAL | Status: AC
  Administered 2024-03-23: 1000 mg via ORAL
  Filled 2024-03-23: qty 2

## 2024-03-23 MED ORDER — DEXAMETHASONE SOD PHOSPHATE PF 10 MG/ML IJ SOLN
INTRAMUSCULAR | Status: DC | PRN
Start: 2024-03-23 — End: 2024-03-23
  Administered 2024-03-23: 4 mg via INTRAVENOUS

## 2024-03-23 MED ORDER — PHENYLEPHRINE 80 MCG/ML (10ML) SYRINGE FOR IV PUSH (FOR BLOOD PRESSURE SUPPORT)
PREFILLED_SYRINGE | INTRAVENOUS | Status: AC
  Filled 2024-03-23: qty 10

## 2024-03-23 MED ORDER — ROPIVACAINE HCL 5 MG/ML IJ SOLN
INTRAMUSCULAR | Status: AC
  Filled 2024-03-23: qty 60

## 2024-03-23 MED ORDER — ALBUMIN HUMAN 5 % IV SOLN
12.5000 g | Freq: Once | INTRAVENOUS | Status: AC
  Administered 2024-03-23: 12.5 g via INTRAVENOUS

## 2024-03-23 MED ORDER — PHENYLEPHRINE 80 MCG/ML (10ML) SYRINGE FOR IV PUSH (FOR BLOOD PRESSURE SUPPORT)
PREFILLED_SYRINGE | INTRAVENOUS | Status: DC | PRN
  Administered 2024-03-23: 80 ug via INTRAVENOUS

## 2024-03-23 MED ORDER — SCOPOLAMINE 1 MG/3DAYS TD PT72: 1.0000 | MEDICATED_PATCH | TRANSDERMAL | Status: DC

## 2024-03-23 MED ORDER — CHLORHEXIDINE GLUCONATE 0.12 % MT SOLN
OROMUCOSAL | Status: AC
  Administered 2024-03-23: 15 mL via OROMUCOSAL
  Filled 2024-03-23: qty 15

## 2024-03-23 MED ORDER — SODIUM CHLORIDE (PF) 0.9 % IJ SOLN
INTRAMUSCULAR | Status: AC
  Filled 2024-03-23: qty 100

## 2024-03-23 SURGICAL SUPPLY — 47 items
BARRIER ADHS 3X4 INTERCEED (GAUZE/BANDAGES/DRESSINGS) IMPLANT
CATH FOLEY 3WAY 5CC 16FR (CATHETERS) IMPLANT
COVER BACK TABLE 60X90IN (DRAPES) ×2 IMPLANT
COVER TIP SHEARS 8 DVNC (MISCELLANEOUS) ×2 IMPLANT
DEFOGGER SCOPE WARM SEASHARP (MISCELLANEOUS) ×2 IMPLANT
DERMABOND ADVANCED .7 DNX12 (GAUZE/BANDAGES/DRESSINGS) ×2 IMPLANT
DRAPE ARM DVNC X/XI (DISPOSABLE) ×8 IMPLANT
DRAPE COLUMN DVNC XI (DISPOSABLE) ×2 IMPLANT
DRAPE SURG IRRIG POUCH 19X23 (DRAPES) ×2 IMPLANT
DRAPE UTILITY XL STRL (DRAPES) ×2 IMPLANT
DRIVER NDL MEGA SUTCUT DVNCXI (INSTRUMENTS) ×2 IMPLANT
DRIVER NDLE MEGA SUTCUT DVNCXI (INSTRUMENTS) ×1 IMPLANT
DURAPREP 26ML APPLICATOR (WOUND CARE) ×2 IMPLANT
ELECTRODE REM PT RTRN 9FT ADLT (ELECTROSURGICAL) ×2 IMPLANT
FORCEPS BPLR 8 MD DVNC XI (FORCEP) ×2 IMPLANT
FORCEPS BPLR LNG DVNC XI (INSTRUMENTS) ×2 IMPLANT
FORCEPS PROGRASP DVNC XI (FORCEP) ×2 IMPLANT
GAUZE 4X4 16PLY ~~LOC~~+RFID DBL (SPONGE) ×2 IMPLANT
GAUZE PETROLATUM 1 X8 (GAUZE/BANDAGES/DRESSINGS) IMPLANT
GLOVE BIO SURGEON STRL SZ7 (GLOVE) ×6 IMPLANT
GLOVE BIOGEL PI IND STRL 7.0 (GLOVE) ×10 IMPLANT
IRRIGATION STRYKERFLOW (MISCELLANEOUS) ×2 IMPLANT
IV 0.9% NACL 1000 ML (IV SOLUTION) IMPLANT
KIT PINK PAD W/HEAD ARM REST (MISCELLANEOUS) ×4 IMPLANT
LEGGING LITHOTOMY PAIR STRL (DRAPES) ×2 IMPLANT
MANIPULATOR ADVINCU DEL 2.5 PL (MISCELLANEOUS) IMPLANT
OBTURATOR OPTICALSTD 8 DVNC (TROCAR) ×2 IMPLANT
OCCLUDER COLPOPNEUMO (BALLOONS) ×2 IMPLANT
PACK ROBOT WH (CUSTOM PROCEDURE TRAY) ×2 IMPLANT
PACK ROBOTIC GOWN (GOWN DISPOSABLE) ×2 IMPLANT
PAD OB MATERNITY 11 LF (PERSONAL CARE ITEMS) ×2 IMPLANT
POWDER SURGICEL 3.0 GRAM (HEMOSTASIS) IMPLANT
SCISSORS MNPLR CVD DVNC XI (INSTRUMENTS) ×2 IMPLANT
SEAL UNIV 5-12 XI (MISCELLANEOUS) ×6 IMPLANT
SEALER VESSEL EXT DVNC XI (MISCELLANEOUS) ×2 IMPLANT
SET IRRIG Y-TYPE CYSTO (SET/KITS/TRAYS/PACK) IMPLANT
SET TRI-LUMEN FLTR TB AIRSEAL (TUBING) ×2 IMPLANT
SOLN 0.9% NACL POUR BTL 1000ML (IV SOLUTION) IMPLANT
SUT VIC AB 0 CT1 27XBRD ANBCTR (SUTURE) IMPLANT
SUT VIC AB 4-0 PS2 18 (SUTURE) ×4 IMPLANT
SUT VICRYL 0 UR6 27IN ABS (SUTURE) ×2 IMPLANT
SUT VLOC 180 0 9IN GS21 (SUTURE) ×2 IMPLANT
TIP ENDOSCOPIC SURGICEL (TIP) IMPLANT
TOWEL GREEN STERILE (TOWEL DISPOSABLE) ×2 IMPLANT
TRAY FOLEY W/BAG SLVR 14FR (SET/KITS/TRAYS/PACK) IMPLANT
TROCAR PORT AIRSEAL 5X120 (TROCAR) ×2 IMPLANT
UNDERPAD 30X36 HEAVY ABSORB (UNDERPADS AND DIAPERS) ×2 IMPLANT

## 2024-03-23 NOTE — Transfer of Care (Signed)
 Immediate Anesthesia Transfer of Care Note  Patient: [name]  Procedure(s) Performed: HYSTERECTOMY, TOTAL, LAPAROSCOPIC, ROBOT-ASSISTED WITH BILATERAL SALPINGO-OOPHRECTOMY  Patient Location: PACU  Anesthesia Type:General  Level of Consciousness: awake, alert , oriented, and patient cooperative  Airway & Oxygen Therapy: Patient Spontanous Breathing and Patient connected to face mask oxygen  Post-op Assessment: Report given to RN and Post -op Vital signs reviewed and stable  Post vital signs: Reviewed and stable  Last Vitals:  Vitals Value Taken Time  BP 121/68 03/23/24 09:45  Temp    Pulse 85 03/23/24 09:48  Resp 20 03/23/24 09:48  SpO2 100 % 03/23/24 09:48  Vitals shown include unfiled device data.  Last Pain:  Vitals:   03/23/24 0614  TempSrc:   PainSc: 0-No pain      Patients Stated Pain Goal: 6 (03/23/24 9391)  Complications: No notable events documented.

## 2024-03-23 NOTE — Anesthesia Procedure Notes (Signed)
 Procedure Name: Intubation Date/Time: 03/23/2024 7:37 AM  Performed by: Nada Corean CROME, CRNAPre-anesthesia Checklist: Suction available, Emergency Drugs available, Patient identified, Timeout performed and Patient being monitored Patient Re-evaluated:Patient Re-evaluated prior to induction Oxygen Delivery Method: Circle system utilized Preoxygenation: Pre-oxygenation with 100% oxygen Induction Type: IV induction Ventilation: Mask ventilation without difficulty Laryngoscope Size: Mac and 4 Grade View: Grade I Tube type: Oral Tube size: 7.0 mm Number of attempts: 1 Airway Equipment and Method: Stylet Placement Confirmation: ETT inserted through vocal cords under direct vision, positive ETCO2 and breath sounds checked- equal and bilateral Secured at: 22 cm Tube secured with: Tape Dental Injury: Teeth and Oropharynx as per pre-operative assessment

## 2024-03-23 NOTE — Discharge Instructions (Signed)

## 2024-03-23 NOTE — Anesthesia Postprocedure Evaluation (Signed)
 Anesthesia Post Note  Patient: Arlie Posch Rosado  Procedure(s) Performed: HYSTERECTOMY, TOTAL, LAPAROSCOPIC, ROBOT-ASSISTED WITH BILATERAL SALPINGO-OOPHRECTOMY     Patient location during evaluation: PACU Anesthesia Type: General Level of consciousness: awake Pain management: pain level controlled Vital Signs Assessment: post-procedure vital signs reviewed and stable Respiratory status: spontaneous breathing, nonlabored ventilation and respiratory function stable Cardiovascular status: blood pressure returned to baseline and stable Postop Assessment: no apparent nausea or vomiting Anesthetic complications: no   No notable events documented.  Last Vitals:  Vitals:   03/23/24 1130 03/23/24 1145  BP: (!) 103/57 108/61  Pulse: 77 94  Resp: (!) 21 19  Temp:    SpO2: 97% 97%    Last Pain:  Vitals:   03/23/24 1145  TempSrc:   PainSc: 0-No pain                 Delon Aisha Arch

## 2024-03-23 NOTE — Op Note (Signed)
 03/23/2024 [name]   Preoperative diagnosis:  thick endometrial stripe, menopausal female Postop diagnosis: Same Procedure: da Vinci robot assisted total laparoscopic hysterectomy and bilateral salpingoophorectomy Anesthesia Gen. Endotracheal Surgeon: Dr. Robbi Render Assistant: RNFA Megan  IV fluids: 1000 cc LR EBL: less than 50 cc Urine output: 25 cc, clear, foley removed before extubation Complications: none Pathology: Uterus with cervix and both ovaries and fallopian tubes Disposition: PACU, stable Findings: Normal uterus but adherent to anterior abdominal wall, normal both ovaries and fallopian tubes. Normal bilateral ureteral peristalsis. Anterior liver adhesion. Normal gross appearance of bowel and omentum  Procedure:  Risks complications of surgery including infection, bleeding, damage to internal organs and other surgery related problems including pneumonia, VTE reviewed and informed written consent was obtained.  Patient was brought to the operating room with IV running. She received 2 gm Ancef . Underwent general anesthesia without difficulty and was given dorsal lithotomy position, prepped and draped in sterile fashion. Foley catheter was placed. Cervix was exposed with a speculum and anterior lip of the cervix was grasped with tenaculum. 0- vicryl stay suture placed. Uterus was sounded to 6 cm. Advincula arc with smallest 2.5 cm cervical ring inserted in uterine cavity and balloon was inflated Ring was at cervico-vaginal junction. Speculum was removed.  Attention was focused on abdomen. Upper umbilical fold 8 mm vertical incision made with scalpel after injecting Marcaine, fascia dissected, grasped with Kocher's and incised, posterior rectus sheath and peritoneum grasped, incised, intraabdominal entry confirmed. Purse string stay stitch on 0-Vicryl taken on fascia and Robot cannula with reducer introduced. Pneumoperitoneum was begun. Laparoscope was introduced and the  peritoneal cavity was evaluated. Trendelenburg position given.   Port sited marked and injected with dilute Ropivacaine. One robotic cannula inserted on right side and one on the left side under vision. And one assist Airflowl on left after marking sites and injecting with Ropivacaine. Robot was docked from left.  Arm 1 was tucked away. Arm 2 had Vessel sealer, arm 3 camera and arm 4 had Fenestrated grasper with bipolar energy.   Dr. Render scrubbed out and went for surgical console.  Uterus, ovaries, tubes and ureters evaluated.  First anterior uterine adhesion to abdominal wall was excised after assessing bladder reflection. Uterus was deviated to the patient's right. The left IP ligament was desiccated and cut, followed by broad ligament and then the left Round ligament which was desiccated and cut. Anterior bladder broad ligament was opened and incised. Posterior broad ligament incised up to the uterosacral ligament and left uterine vessels skeletonized. Uterus was deviated to the left and right IP was exposed, desiccated and incised followed by broad ligament and right Round ligament. Anterior broad ligament was incised to create bladder flap, bladder was pushed away by blunt and sharp dissection with excellent hemostasis watching carefully due to C/section history and adhesions. Koh ring impression at cervicovaginal junction was seen well anteriorly. Right posterior broad ligament dissected, right Uterine vessels skeletonized. Right uterine vessels were desiccated and cut. Uterus was deviated to the right and the left uterine vessels were desiccated and cut. Vaginal occluder was inflated. Colpotomy was begun starting from midline anteriorly coming to the left and right and then circumferentially staying above the uterosacral ligaments posteriorly. Uterus, cervix, both tubes and ovaries were pulled out of the vaginal opening and vaginal occluder balloon placed to maintain pneumoperitoneum.  Vaginal cut  edges were evaluated for hemostasis which was excellent. Cuff closure done using 0-V lock in two layers, starting on right, ending  at left and returning to right with good hemostasis.  Irrigation was performed, all pedicles appeared to be hemostatic. Robotic instruments were removed. Robot was undocked. Patient was made supine. Lap'scope was reintroduced, hemostasis was excellent. 20 cc remaining Ropivacaine instilled in peritoneum.  All cannulas were removed under vision. Stay sutures at the fascia tied together with excellent fascial closure. Skin approximated with subcuticular stitches on 4-0 Vicryl. Dermabond was applied. No vaginal bleeding noted on vaginal exam at the end. Foley removed.  All instruments/lap/sponges counts were correct x2.  No complications. Patient tolerated procedure well and was reversed from anesthesia and brought to the PACU stable condition.   Dr Barbette was the surgeon for entire case.   Robbi Barbette MD

## 2024-03-24 ENCOUNTER — Encounter (HOSPITAL_COMMUNITY): Payer: Self-pay | Admitting: Obstetrics & Gynecology

## 2024-03-24 LAB — POCT PREGNANCY, URINE: Preg Test, Ur: NEGATIVE

## 2024-03-27 LAB — SURGICAL PATHOLOGY

## 2024-03-28 ENCOUNTER — Emergency Department (HOSPITAL_BASED_OUTPATIENT_CLINIC_OR_DEPARTMENT_OTHER)
Admission: EM | Admit: 2024-03-28 | Discharge: 2024-03-28 | Disposition: A | Discharge status: Home / Self Care | Attending: Emergency Medicine | Admitting: Emergency Medicine

## 2024-03-28 ENCOUNTER — Other Ambulatory Visit (HOSPITAL_BASED_OUTPATIENT_CLINIC_OR_DEPARTMENT_OTHER): Payer: Self-pay

## 2024-03-28 ENCOUNTER — Other Ambulatory Visit: Payer: Self-pay

## 2024-03-28 ENCOUNTER — Encounter (HOSPITAL_BASED_OUTPATIENT_CLINIC_OR_DEPARTMENT_OTHER): Payer: Self-pay | Admitting: Emergency Medicine

## 2024-03-28 ENCOUNTER — Encounter: Payer: Self-pay | Admitting: Family Medicine

## 2024-03-28 DIAGNOSIS — R112 Nausea with vomiting, unspecified: Secondary | ICD-10-CM | POA: Insufficient documentation

## 2024-03-28 DIAGNOSIS — K9189 Other postprocedural complications and disorders of digestive system: Secondary | ICD-10-CM | POA: Diagnosis not present

## 2024-03-28 DIAGNOSIS — R110 Nausea: Secondary | ICD-10-CM | POA: Diagnosis not present

## 2024-03-28 DIAGNOSIS — R42 Dizziness and giddiness: Secondary | ICD-10-CM | POA: Insufficient documentation

## 2024-03-28 DIAGNOSIS — R638 Other symptoms and signs concerning food and fluid intake: Secondary | ICD-10-CM | POA: Diagnosis not present

## 2024-03-28 LAB — CBC
HCT: 39.8 % (ref 36.0–46.0)
Hemoglobin: 13.1 g/dL (ref 12.0–15.0)
MCH: 30 pg (ref 26.0–34.0)
MCHC: 32.9 g/dL (ref 30.0–36.0)
MCV: 91.1 fL (ref 80.0–100.0)
Platelets: 243 K/uL (ref 150–400)
RBC: 4.37 MIL/uL (ref 3.87–5.11)
RDW: 12.6 % (ref 11.5–15.5)
WBC: 7.4 K/uL (ref 4.0–10.5)
nRBC: 0 % (ref 0.0–0.2)

## 2024-03-28 LAB — COMPREHENSIVE METABOLIC PANEL WITH GFR
ALT: 23 U/L (ref 0–44)
AST: 24 U/L (ref 15–41)
Albumin: 4.3 g/dL (ref 3.5–5.0)
Alkaline Phosphatase: 63 U/L (ref 38–126)
Anion gap: 9 (ref 5–15)
BUN: 16 mg/dL (ref 6–20)
CO2: 29 mmol/L (ref 22–32)
Calcium: 10.2 mg/dL (ref 8.9–10.3)
Chloride: 105 mmol/L (ref 98–111)
Creatinine, Ser: 0.7 mg/dL (ref 0.44–1.00)
GFR, Estimated: >60 mL/min (ref >60)
Glucose, Bld: 121 mg/dL — ABNORMAL HIGH (ref 70–99)
Potassium: 4 mmol/L (ref 3.5–5.1)
Sodium: 143 mmol/L (ref 135–145)
Total Bilirubin: 0.3 mg/dL (ref 0.0–1.2)
Total Protein: 7.2 g/dL (ref 6.5–8.1)

## 2024-03-28 LAB — LIPASE, BLOOD: Lipase: 23 U/L (ref 11–51)

## 2024-03-28 MED ORDER — ONDANSETRON HCL 4 MG/2ML IJ SOLN
4.0000 mg | Freq: Once | INTRAMUSCULAR | Status: AC
  Administered 2024-03-28: 4 mg via INTRAVENOUS
  Filled 2024-03-28: qty 2

## 2024-03-28 MED ORDER — DROPERIDOL 2.5 MG/ML IJ SOLN
0.6250 mg | Freq: Once | INTRAMUSCULAR | Status: AC
  Administered 2024-03-28: 0.625 mg via INTRAVENOUS
  Filled 2024-03-28: qty 2

## 2024-03-28 MED ORDER — LACTATED RINGERS IV BOLUS
1000.0000 mL | Freq: Once | INTRAVENOUS | Status: AC
  Administered 2024-03-28: 1000 mL via INTRAVENOUS

## 2024-03-28 MED ORDER — PROMETHAZINE HCL 25 MG PO TABS
25.0000 mg | ORAL_TABLET | Freq: Four times a day (QID) | ORAL | 0 refills | Status: AC | PRN
  Filled 2024-03-28: qty 30, 8d supply, fill #0

## 2024-03-28 NOTE — ED Notes (Signed)
 Mild bruising noted to RAC, pt request IV placed at previous IV site. Denies tenderness

## 2024-03-28 NOTE — ED Notes (Signed)
 Reviewed discharge instructions, medications, and home care with pt. Pt verbalized understanding and had no further questions. Pt exited ED without complications.

## 2024-03-28 NOTE — ED Triage Notes (Signed)
 Reports dizziness and nausea since having total hysterectomy on Thursday. Denies abd pain, fevers, or any other surgery related complications.

## 2024-03-28 NOTE — ED Provider Notes (Signed)
 [place] EMERGENCY DEPARTMENT AT [facility] Provider Note   CSN: [id number] Arrival date & time: 03/28/24  1120     Patient presents with: Dizziness   [name] is a 53 y.o. female.   HPI 53 year old female presents today complaining of nausea and lightheadedness.  She states that the symptoms have been present since she had surgery last week.  She had a laparoscopic assisted.  She had discharged to home same day.  She states that she has not had any problems since that time with the pain in her abdomen or bleeding.  She removed her scopolamine  patch on Sunday.  She has had ongoing nausea.  She has been eating and drinking although decreased.  She denies abdominal pain, fevers, urinary symptoms, or diarrhea.  She reports that she has not required any pain medicine.    Prior to Admission medications   Medication Sig Start Date End Date Taking? Authorizing Provider  promethazine  (PHENERGAN ) 25 MG tablet Take 1 tablet (25 mg total) by mouth every 6 (six) hours as needed for nausea or vomiting. 03/28/24  Yes Levander Houston, MD  acetaminophen  (TYLENOL ) 325 MG tablet Take 2 tablets (650 mg total) by mouth every 6 (six) hours as needed. 03/23/24   Barbette Knock, MD  citalopram  (CELEXA ) 20 MG tablet Take 1 tablet (20 mg total) by mouth daily. Patient not taking: Reported on 03/16/2024 01/31/24   Duanne Butler DASEN, MD  docusate sodium  (COLACE) 100 MG capsule Take 1 capsule (100 mg total) by mouth 2 (two) times daily. 03/23/24   Barbette Knock, MD  hydrOXYzine  (ATARAX ) 25 MG tablet Take 1 tablet (25 mg total) by mouth 3 (three) times daily as needed. 01/31/24   Duanne Butler DASEN, MD  ibuprofen  (ADVIL ) 200 MG tablet Take 3 tablets (600 mg total) by mouth every 6 (six) hours as needed. 03/23/24   Mody, Vaishali, MD  LYLLANA 0.05 MG/24HR patch Place 1 patch onto the skin 2 (two) times a week. Patient not taking: Reported on 03/16/2024 10/11/23   [provider]  rosuvastatin   (CRESTOR ) 10 MG tablet Take 1 tablet (10 mg total) by mouth daily. Patient taking differently: Take 10 mg by mouth daily. 11/22/23   Duanne Butler DASEN, MD  solifenacin  (VESICARE ) 10 MG tablet Take 1 tablet (10 mg total) by mouth daily. Patient not taking: Reported on 03/16/2024 10/12/23   Duanne Butler DASEN, MD  traMADol  (ULTRAM ) 50 MG tablet Take 1 tablet (50 mg total) by mouth every 6 (six) hours as needed for up to 7 days. 03/23/24 03/30/24  Barbette Knock, MD    Allergies: Codeine, Macrobid [nitrofurantoin], Morphine, and Hydrocodone    Review of Systems  Updated Vital Signs BP 129/73 (BP Location: Right Arm)   Pulse 87   Temp 98.5 F (36.9 C) (Oral)   Resp 18   LMP 04/06/2018 (Approximate)   SpO2 98%   Physical Exam Vitals and nursing note reviewed.  Constitutional:      Appearance: Normal appearance.  HENT:     Head: Normocephalic and atraumatic.     Right Ear: External ear normal.     Left Ear: External ear normal.     Nose: Nose normal.     Mouth/Throat:     Pharynx: Oropharynx is clear.  Eyes:     Pupils: Pupils are equal, round, and reactive to light.  Cardiovascular:     Rate and Rhythm: Normal rate and regular rhythm.     Pulses: Normal pulses.  Pulmonary:  Effort: Pulmonary effort is normal.  Abdominal:     General: Abdomen is flat. Bowel sounds are normal. There is no distension.     Palpations: Abdomen is soft. There is no mass.     Tenderness: There is no abdominal tenderness. There is no guarding or rebound.  Musculoskeletal:        General: Normal range of motion.     Cervical back: Normal range of motion.  Skin:    General: Skin is warm.     Capillary Refill: Capillary refill takes less than 2 seconds.  Neurological:     General: No focal deficit present.     Mental Status: She is alert.  Psychiatric:        Mood and Affect: Mood normal.     (all labs ordered are listed, but only abnormal results are displayed) Labs Reviewed  COMPREHENSIVE  METABOLIC PANEL WITH GFR - Abnormal; Notable for the following components:      Result Value   Glucose, Bld 121 (*)    All other components within normal limits  CBC  LIPASE, BLOOD    EKG: None  Radiology: No results found.   Procedures   Medications Ordered in the ED  lactated ringers  bolus 1,000 mL (0 mLs Intravenous Stopped 03/28/24 1330)  ondansetron  (ZOFRAN ) injection 4 mg (4 mg Intravenous Given 03/28/24 1213)  droperidol  (INAPSINE ) 2.5 MG/ML injection 0.625 mg (0.625 mg Intravenous Given 03/28/24 1337)                                    Medical Decision Making Amount and/or Complexity of Data Reviewed Labs: ordered.  Risk Prescription drug management.   52 year old female with surgery last Thursday presents today complaining of ongoing nausea.  She denies any associated pain, fever, or chills.  She does give some history of prior anesthesia related nausea.  She has been feeling somewhat dizzy and lightheaded with standing.  She has not had any lateralized symptoms such as vision change, facial drooping, arm or leg weakness or ataxia. Given patient's decreased p.o. intake she is likely having some volume depletion.  She is given IV fluids and labs are checked here.  Labs are within normal limits. Patient received Zofran  but continues to feel nauseated. I am given additional dose of droperidol . Reevaluate after droperidol  and feels improved.  Will give prescription for Phenergan .  Discussed return precautions and need for follow-up voices understanding     Final diagnoses:  Post-operative nausea and vomiting    ED Discharge Orders          Ordered    promethazine  (PHENERGAN ) 25 MG tablet  Every 6 hours PRN        03/28/24 1407               Levander Houston, MD 03/28/24 1407

## 2024-04-03 ENCOUNTER — Ambulatory Visit: Admitting: Family Medicine

## 2024-04-04 ENCOUNTER — Encounter: Payer: Self-pay | Admitting: Family Medicine

## 2024-04-04 ENCOUNTER — Ambulatory Visit: Admitting: Family Medicine

## 2024-04-04 VITALS — BP 120/78 | HR 84 | Temp 98.1°F | Ht 65.5 in | Wt 121.2 lb

## 2024-04-04 DIAGNOSIS — R110 Nausea: Secondary | ICD-10-CM | POA: Diagnosis not present
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
## 2024-04-05 LAB — CBC WITH DIFFERENTIAL/PLATELET
Absolute Lymphocytes: 2744 {cells}/uL (ref 850–3900)
Absolute Monocytes: 512 {cells}/uL (ref 200–950)
Basophils Absolute: 88 {cells}/uL (ref 0–200)
Basophils Relative: 1.1 %
Eosinophils Absolute: 216 {cells}/uL (ref 15–500)
Eosinophils Relative: 2.7 %
HCT: 40.9 % (ref 35.9–46.0)
Hemoglobin: 13.4 g/dL (ref 11.7–15.5)
MCH: 29.7 pg (ref 27.0–33.0)
MCHC: 32.8 g/dL (ref 31.6–35.4)
MCV: 90.7 fL (ref 81.4–101.7)
MPV: 10.6 fL (ref 7.5–12.5)
Monocytes Relative: 6.4 %
Neutro Abs: 4440 {cells}/uL (ref 1500–7800)
Neutrophils Relative %: 55.5 %
Platelets: 325 Thousand/uL (ref 140–400)
RBC: 4.51 Million/uL (ref 3.80–5.10)
RDW: 12.2 % (ref 11.0–15.0)
Total Lymphocyte: 34.3 %
WBC: 8 Thousand/uL (ref 3.8–10.8)

## 2024-04-05 LAB — COMPREHENSIVE METABOLIC PANEL WITH GFR
AG Ratio: 2 (calc) (ref 1.0–2.5)
ALT: 15 U/L (ref 6–29)
AST: 19 U/L (ref 10–35)
Albumin: 4.5 g/dL (ref 3.6–5.1)
Alkaline phosphatase (APISO): 64 U/L (ref 37–153)
BUN: 22 mg/dL (ref 7–25)
CO2: 30 mmol/L (ref 20–32)
Calcium: 9.7 mg/dL (ref 8.6–10.4)
Chloride: 102 mmol/L (ref 98–110)
Creat: 0.7 mg/dL (ref 0.50–1.03)
Globulin: 2.3 g/dL (ref 1.9–3.7)
Glucose, Bld: 143 mg/dL — ABNORMAL HIGH (ref 65–99)
Potassium: 4.6 mmol/L (ref 3.5–5.3)
Sodium: 139 mmol/L (ref 135–146)
Total Bilirubin: 0.3 mg/dL (ref 0.2–1.2)
Total Protein: 6.8 g/dL (ref 6.1–8.1)
eGFR: 103 mL/min/1.73m2 (ref >=60)

## 2024-04-05 LAB — TSH: TSH: 1.61 m[IU]/L

## 2024-04-06 ENCOUNTER — Ambulatory Visit: Payer: Self-pay | Admitting: Family Medicine

## 2024-04-07 DIAGNOSIS — Z4889 Encounter for other specified surgical aftercare: Secondary | ICD-10-CM | POA: Diagnosis not present

## 2024-04-07 DIAGNOSIS — N939 Abnormal uterine and vaginal bleeding, unspecified: Secondary | ICD-10-CM | POA: Diagnosis not present

## 2024-04-19 ENCOUNTER — Encounter: Payer: Self-pay | Admitting: Family Medicine

## 2024-05-25 ENCOUNTER — Ambulatory Visit: Admitting: Family Medicine

## 2024-05-26 ENCOUNTER — Ambulatory Visit: Admitting: Family Medicine

## 2024-05-26 ENCOUNTER — Encounter: Payer: Self-pay | Admitting: Family Medicine

## 2024-05-26 VITALS — BP 120/62 | HR 70 | Temp 98.3°F | Ht 65.5 in | Wt 124.6 lb

## 2024-05-26 DIAGNOSIS — G4700 Insomnia, unspecified: Secondary | ICD-10-CM | POA: Diagnosis not present

## 2024-05-26 DIAGNOSIS — R350 Frequency of micturition: Secondary | ICD-10-CM

## 2024-05-26 LAB — URINALYSIS, ROUTINE W REFLEX MICROSCOPIC
Bacteria, UA: NONE SEEN /HPF
Bilirubin Urine: NEGATIVE
Glucose, UA: NEGATIVE
Hyaline Cast: NONE SEEN /LPF
Ketones, ur: NEGATIVE
Leukocytes,Ua: NEGATIVE
Nitrite: NEGATIVE
Protein, ur: NEGATIVE
Specific Gravity, Urine: 1.022 (ref 1.001–1.035)
WBC, UA: NONE SEEN /HPF (ref 0–5)
pH: 6 (ref 5.0–8.0)

## 2024-05-26 LAB — MICROSCOPIC MESSAGE
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
## 2024-06-01 ENCOUNTER — Encounter: Payer: Self-pay | Admitting: Family Medicine

## 2024-06-01 ENCOUNTER — Ambulatory Visit: Admitting: Family Medicine

## 2024-06-01 VITALS — BP 122/82 | HR 56 | Ht 65.5 in | Wt 124.0 lb

## 2024-06-01 DIAGNOSIS — U071 COVID-19: Secondary | ICD-10-CM

## 2024-06-01 DIAGNOSIS — Z1152 Encounter for screening for COVID-19: Secondary | ICD-10-CM | POA: Diagnosis not present

## 2024-06-01 LAB — POC COVID19 BINAXNOW: SARS Coronavirus 2 Ag: POSITIVE — AB

## 2024-06-01 MED ORDER — PAXLOVID (300/100) 20 X 150 MG & 10 X 100MG PO TBPK: 3.0000 | ORAL_TABLET | Freq: Two times a day (BID) | ORAL | 0 refills | Status: DC

## 2024-06-01 MED ORDER — PAXLOVID (300/100) 20 X 150 MG & 10 X 100MG PO TBPK: 3.0000 | ORAL_TABLET | Freq: Two times a day (BID) | ORAL | 0 refills | Status: AC
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
